# Patient Record
Sex: Male | Born: 1975 | Hispanic: No | Marital: Married | Smoking: Never smoker
Health system: Southern US, Community
[De-identification: ages and names within clinical notes are randomized; demographics above are authoritative.]

## PROBLEM LIST (undated history)

## (undated) HISTORY — PX: MANDIBLE SURGERY: SHX707

---

## 1998-09-15 DIAGNOSIS — F32A Depression, unspecified: Secondary | ICD-10-CM

## 1998-09-15 HISTORY — PX: CHOLECYSTECTOMY: SHX55

## 1998-09-15 HISTORY — DX: Depression, unspecified: F32.A

## 2016-10-17 DIAGNOSIS — C44612 Basal cell carcinoma of skin of right upper limb, including shoulder: Secondary | ICD-10-CM | POA: Diagnosis not present

## 2016-10-17 DIAGNOSIS — L821 Other seborrheic keratosis: Secondary | ICD-10-CM | POA: Diagnosis not present

## 2016-10-17 DIAGNOSIS — L814 Other melanin hyperpigmentation: Secondary | ICD-10-CM | POA: Diagnosis not present

## 2016-10-17 DIAGNOSIS — D225 Melanocytic nevi of trunk: Secondary | ICD-10-CM | POA: Diagnosis not present

## 2016-12-17 DIAGNOSIS — Z85828 Personal history of other malignant neoplasm of skin: Secondary | ICD-10-CM | POA: Diagnosis not present

## 2016-12-17 DIAGNOSIS — L905 Scar conditions and fibrosis of skin: Secondary | ICD-10-CM | POA: Diagnosis not present

## 2017-02-27 ENCOUNTER — Encounter (INDEPENDENT_AMBULATORY_CARE_PROVIDER_SITE_OTHER): Payer: Self-pay | Admitting: Orthopaedic Surgery

## 2017-02-27 ENCOUNTER — Ambulatory Visit (INDEPENDENT_AMBULATORY_CARE_PROVIDER_SITE_OTHER): Payer: BLUE CROSS/BLUE SHIELD | Admitting: Orthopaedic Surgery

## 2017-02-27 ENCOUNTER — Ambulatory Visit (INDEPENDENT_AMBULATORY_CARE_PROVIDER_SITE_OTHER): Payer: Self-pay

## 2017-02-27 VITALS — BP 136/88 | HR 59 | Ht 68.0 in | Wt 155.0 lb

## 2017-02-27 DIAGNOSIS — M7501 Adhesive capsulitis of right shoulder: Secondary | ICD-10-CM

## 2017-02-27 DIAGNOSIS — M25511 Pain in right shoulder: Secondary | ICD-10-CM

## 2017-02-27 NOTE — Progress Notes (Signed)
Office Visit Note   Patient: Micheal Griffith           Date of Birth: 12/04/1975           MRN: [id number] Visit Date: 02/27/2017              Requested by: No referring provider defined for this encounter. PCP: System, Pcp Not In   Assessment & Plan: Visit Diagnoses:  1. Acute pain of right shoulder   2. Adhesive capsulitis of right shoulder     Plan: We'll refer him for physical therapy. He is in considerable pain control with the dressing driving changing clothes etc. Subacromial injection performed with some slight improvement in his pain. I'll recheck him in 2 weeks.  Follow-Up Instructions: Return in about 2 weeks (around 03/13/2017).   Orders:  Orders Placed This Encounter  Procedures  . XR Shoulder Right   No orders of the defined types were placed in this encounter.     Procedures: No procedures performed   Clinical Data: No additional findings.   Subjective: Chief Complaint  Patient presents with  . Right Shoulder - Pain    HPI 41 year old male with the almost 2 week history of the right shoulder pain insidious. He normally does some pushups flies and some bicep curls and a regular basis but has not had any change in his activity levels. No injuries no past history of shoulder pain. Patient states he can only flex or abduct his shoulder 15. He can't reach his back pocket can't reach his opposite shoulder can get his hand to his ear and states the pain is severe. Negative history rheumatologic disease no fever or chills.  Review of Systems patient's been active healthy does a discharge. Negative history of the cervical spondylosis negative for rheumatologic disease no history of pneumonia no falls recently. Otherwise negative as it pertains to his history of present illness.   Objective: Vital Signs: BP 136/88   Pulse (!) 59   Ht 5\' 8"  (1.727 m)   Wt 155 lb (70.3 kg)   BMI 23.57 kg/m   Physical Exam  Constitutional: He is oriented to person, place,  and time. He appears well-developed and well-nourished.  HENT:  Head: Normocephalic and atraumatic.  Eyes: EOM are normal. Pupils are equal, round, and reactive to light.  Neck: No tracheal deviation present. No thyromegaly present.  Cardiovascular: Normal rate.   Pulmonary/Chest: Effort normal. He has no wheezes.  Abdominal: Soft. Bowel sounds are normal.  Musculoskeletal:  Patient has good cervical range of motion no break complex tenderness no periscapular atrophy. Abduction only 15. He can get his hand to the midaxillary line only with internal rotation. He can reach to his the opposite front pant pocket. Elbow has good flexion-extension sensation the hand is normal. Lung of the biceps is minimally tender coracoid is minimally tender. Shoulder has no increased warmth. Reflexes biceps triceps are intact. Normal heel toe gait medicine synovitis of the wrists fingers. No rash or exposed skin.  Neurological: He is alert and oriented to person, place, and time.  Skin: Skin is warm and dry. Capillary refill takes less than 2 seconds.  Psychiatric: He has a normal mood and affect. His behavior is normal. Judgment and thought content normal.    Ortho Exam  Specialty Comments:  No specialty comments available.  Imaging: Xr Shoulder Right  Result Date: 02/27/2017 Three-view x-rays right shoulder obtained which shows normal shoulder anatomy. Negative for fracture lung field is clear. Acromioclavicular  joint is normal. Impression normal right shoulder x-rays    PMFS History: There are no active problems to display for this patient.  No past medical history on file.  No family history on file.  No past surgical history on file. Social History   Occupational History  . Not on file.   Social History Main Topics  . Smoking status: Not on file  . Smokeless tobacco: Not on file  . Alcohol use Not on file  . Drug use: Unknown  . Sexual activity: Not on file

## 2017-03-25 ENCOUNTER — Ambulatory Visit (INDEPENDENT_AMBULATORY_CARE_PROVIDER_SITE_OTHER): Payer: BLUE CROSS/BLUE SHIELD | Admitting: Orthopaedic Surgery

## 2017-07-07 DIAGNOSIS — Z85828 Personal history of other malignant neoplasm of skin: Secondary | ICD-10-CM | POA: Diagnosis not present

## 2017-07-07 DIAGNOSIS — L249 Irritant contact dermatitis, unspecified cause: Secondary | ICD-10-CM | POA: Diagnosis not present

## 2018-02-09 DIAGNOSIS — J069 Acute upper respiratory infection, unspecified: Secondary | ICD-10-CM | POA: Diagnosis not present

## 2018-02-09 DIAGNOSIS — R05 Cough: Secondary | ICD-10-CM | POA: Diagnosis not present

## 2018-06-17 DIAGNOSIS — Z23 Encounter for immunization: Secondary | ICD-10-CM | POA: Diagnosis not present

## 2018-06-22 DIAGNOSIS — H5213 Myopia, bilateral: Secondary | ICD-10-CM | POA: Diagnosis not present

## 2018-06-22 DIAGNOSIS — H52223 Regular astigmatism, bilateral: Secondary | ICD-10-CM | POA: Diagnosis not present

## 2018-07-09 DIAGNOSIS — L821 Other seborrheic keratosis: Secondary | ICD-10-CM | POA: Diagnosis not present

## 2018-07-09 DIAGNOSIS — D1801 Hemangioma of skin and subcutaneous tissue: Secondary | ICD-10-CM | POA: Diagnosis not present

## 2018-07-09 DIAGNOSIS — D229 Melanocytic nevi, unspecified: Secondary | ICD-10-CM | POA: Diagnosis not present

## 2018-07-09 DIAGNOSIS — L814 Other melanin hyperpigmentation: Secondary | ICD-10-CM | POA: Diagnosis not present

## 2018-07-16 DIAGNOSIS — J300 Vasomotor rhinitis: Secondary | ICD-10-CM | POA: Diagnosis not present

## 2019-07-13 DIAGNOSIS — Z3009 Encounter for other general counseling and advice on contraception: Secondary | ICD-10-CM | POA: Diagnosis not present

## 2019-08-15 DIAGNOSIS — Z302 Encounter for sterilization: Secondary | ICD-10-CM | POA: Diagnosis not present

## 2019-09-25 DIAGNOSIS — M791 Myalgia, unspecified site: Secondary | ICD-10-CM | POA: Diagnosis not present

## 2019-09-25 DIAGNOSIS — R05 Cough: Secondary | ICD-10-CM | POA: Diagnosis not present

## 2019-10-27 DIAGNOSIS — Z20822 Contact with and (suspected) exposure to covid-19: Secondary | ICD-10-CM | POA: Diagnosis not present

## 2019-10-27 DIAGNOSIS — R5381 Other malaise: Secondary | ICD-10-CM | POA: Diagnosis not present

## 2019-10-27 DIAGNOSIS — R05 Cough: Secondary | ICD-10-CM | POA: Diagnosis not present

## 2019-10-27 DIAGNOSIS — M791 Myalgia, unspecified site: Secondary | ICD-10-CM | POA: Diagnosis not present

## 2019-12-01 ENCOUNTER — Ambulatory Visit: Payer: BC Managed Care – PPO | Attending: Internal Medicine

## 2019-12-01 DIAGNOSIS — Z23 Encounter for immunization: Secondary | ICD-10-CM

## 2019-12-01 NOTE — Progress Notes (Signed)
   Covid-19 Vaccination Clinic  Name:  JAMIEL GONCALVES    MRN: [id number] DOB: 22-Mar-1976  12/01/2019  Mr. Hanway was observed post Covid-19 immunization for 15 minutes without incident. He was provided with Vaccine Information Sheet and instruction to access the V-Safe system.   Mr. Tschantz was instructed to call 911 with any severe reactions post vaccine: Marland Kitchen Difficulty breathing  . Swelling of face and throat  . A fast heartbeat  . A bad rash all over body  . Dizziness and weakness   Immunizations Administered    Name Date Dose VIS Date Route   Pfizer COVID-19 Vaccine 12/01/2019  9:02 AM 0.3 mL 08/26/2019 Intramuscular   Manufacturer: [employer], [employer]   Lot: [id number]   NDC: [address]-5

## 2019-12-26 ENCOUNTER — Ambulatory Visit: Payer: BC Managed Care – PPO | Attending: Internal Medicine

## 2019-12-26 DIAGNOSIS — Z23 Encounter for immunization: Secondary | ICD-10-CM

## 2019-12-26 NOTE — Progress Notes (Signed)
   Covid-19 Vaccination Clinic  Name:  Micheal Griffith    MRN: [id number] DOB: 07-Oct-1975  12/26/2019  [name] was observed post Covid-19 immunization for 15 minutes without incident. He was provided with Vaccine Information Sheet and instruction to access the V-Safe system.   [name] was instructed to call 911 with any severe reactions post vaccine: Marland Kitchen Difficulty breathing  . Swelling of face and throat  . A fast heartbeat  . A bad rash all over body  . Dizziness and weakness   Immunizations Administered    Name Date Dose VIS Date Route   Pfizer COVID-19 Vaccine 12/26/2019  9:01 AM 0.3 mL 08/26/2019 Intramuscular   Manufacturer: [employer], [employer]   Lot: [id number]   NDC: [address]-0

## 2020-01-03 ENCOUNTER — Ambulatory Visit (INDEPENDENT_AMBULATORY_CARE_PROVIDER_SITE_OTHER): Payer: BC Managed Care – PPO | Admitting: Otolaryngology

## 2020-01-03 ENCOUNTER — Other Ambulatory Visit: Payer: Self-pay

## 2020-01-03 DIAGNOSIS — J342 Deviated nasal septum: Secondary | ICD-10-CM | POA: Diagnosis not present

## 2020-01-03 DIAGNOSIS — J310 Chronic rhinitis: Secondary | ICD-10-CM | POA: Diagnosis not present

## 2020-01-03 DIAGNOSIS — J343 Hypertrophy of nasal turbinates: Secondary | ICD-10-CM

## 2020-01-03 NOTE — Progress Notes (Signed)
HPI: Micheal Griffith is a 44 y.o. male who presents for evaluation of intermittent chronic nasal obstruction.  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  He finally weaned himself off going cold [place] for 2 to 3 weeks.  He still has occasional nasal congestion especially at night when he sleeps.  More recently has started using decongestant spray at night but only on the left side.  He tends to have more difficulty breathing when the left side obstructs.  Denies any yellow-green discharge no paranasal sinus pain.  Mostly just nasal obstruction.  He has used Flonase in the past with some benefit but is presently not on any nasal steroid sprays. He is otherwise healthy. He is on no medications Allergies: Sulfa-based drugs  No past medical history on file.  Social History   Socioeconomic History  . Marital status: Single    Spouse name: Not on file  . Number of children: Not on file  . Years of education: Not on file  . Highest education level: Not on file  Occupational History  . Not on file  Tobacco Use  . Smoking status: Not on file  Substance and Sexual Activity  . Alcohol use: Not on file  . Drug use: Not on file  . Sexual activity: Not on file  Other Topics Concern  . Not on file  Social History Narrative  . Not on file   Social Determinants of Health   Financial Resource Strain:   . Difficulty of Paying Living Expenses:   Food Insecurity:   . Worried About Charity fundraiser in the Last Year:   . Arboriculturist in the Last Year:   Transportation Needs:   . Film/video editor (Medical):   Marland Kitchen Lack of Transportation (Non-Medical):   Physical Activity:   . Days of Exercise per Week:   . Minutes of Exercise per Session:   Stress:   . Feeling of Stress :   Social Connections:   . Frequency of Communication with Friends and Family:   . Frequency of Social Gatherings with Friends and Family:   .  Attends Religious Services:   . Active Member of Clubs or Organizations:   . Attends Archivist Meetings:   Marland Kitchen Marital Status:    No family history on file. Allergies  Allergen Reactions  . Sulfa Antibiotics    Prior to Admission medications   Not on File     Positive ROS: Otherwise negative  All other systems have been reviewed and were otherwise negative with the exception of those mentioned in the HPI and as above.  Physical Exam: Constitutional: Alert, well-appearing, no acute distress Ears: External ears without lesions or tenderness. Ear canals are clear bilaterally with intact, clear TMs.  Nasal: External nose without lesions. Septum deviated to the right.  He has moderate turbinate per trophy.  No polyps.  No obstructing lesions noted.  Both middle meatus regions are clear.  He has a narrow nasal valve on both sides but more narrow on the right side because of anterior septal deviation to the right.  He has slight nasal valve collapse on inhalation. Oral: Lips and gums without lesions. Tongue and palate mucosa without lesions. Posterior oropharynx clear. Neck: No palpable adenopathy or masses. Respiratory: Breathing comfortably.  Lungs clear to auscultation Cardiac exam: Regular rate and rhythm without murmur. Skin: No facial/neck lesions or rash noted.  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 Chronic rhinitis which is worse at night when he sleeps.  Plan: Recommended initially regular use of nasal steroid spray and prescribe either Nasacort or Flonase to use 2 sprays each nostril at night. If he continues to have nasal obstruction he would be a surgical candidate which would involve septoplasty and turbinate reductions with possible vestibuloplasty at least on the right side and possibly on the left side. He will initially try the nasal steroid spray and call us back if he decides to pursue surgery.  Narda Bonds, MD

## 2020-04-26 DIAGNOSIS — Z20822 Contact with and (suspected) exposure to covid-19: Secondary | ICD-10-CM | POA: Diagnosis not present

## 2020-06-28 DIAGNOSIS — Z23 Encounter for immunization: Secondary | ICD-10-CM | POA: Diagnosis not present

## 2020-08-13 ENCOUNTER — Encounter: Payer: Self-pay | Admitting: Internal Medicine

## 2020-08-13 ENCOUNTER — Ambulatory Visit: Payer: BC Managed Care – PPO | Admitting: Internal Medicine

## 2020-08-13 ENCOUNTER — Other Ambulatory Visit: Payer: Self-pay

## 2020-08-13 ENCOUNTER — Telehealth: Payer: Self-pay | Admitting: Internal Medicine

## 2020-08-13 VITALS — BP 114/78 | HR 65 | Temp 98.3°F | Resp 16 | Ht 68.0 in | Wt 157.0 lb

## 2020-08-13 DIAGNOSIS — F411 Generalized anxiety disorder: Secondary | ICD-10-CM | POA: Diagnosis not present

## 2020-08-13 DIAGNOSIS — F322 Major depressive disorder, single episode, severe without psychotic features: Secondary | ICD-10-CM

## 2020-08-13 LAB — BASIC METABOLIC PANEL
BUN: 19 mg/dL (ref 6–23)
CO2: 31 mEq/L (ref 19–32)
Calcium: 10 mg/dL (ref 8.4–10.5)
Chloride: 100 mEq/L (ref 96–112)
Creatinine, Ser: 0.79 mg/dL (ref 0.40–1.50)
GFR: 107.96 mL/min (ref >60.00)
Glucose, Bld: 98 mg/dL (ref 70–99)
Potassium: 3.7 mEq/L (ref 3.5–5.1)
Sodium: 138 mEq/L (ref 135–145)

## 2020-08-13 LAB — CBC WITH DIFFERENTIAL/PLATELET
Basophils Absolute: 0.1 10*3/uL (ref 0.0–0.1)
Basophils Relative: 1 % (ref 0.0–3.0)
Eosinophils Absolute: 0.2 10*3/uL (ref 0.0–0.7)
Eosinophils Relative: 2.6 % (ref 0.0–5.0)
HCT: 50.7 % (ref 39.0–52.0)
Hemoglobin: 17.4 g/dL — ABNORMAL HIGH (ref 13.0–17.0)
Lymphocytes Relative: 25.4 % (ref 12.0–46.0)
Lymphs Abs: 1.6 10*3/uL (ref 0.7–4.0)
MCHC: 34.3 g/dL (ref 30.0–36.0)
MCV: 92.9 fl (ref 78.0–100.0)
Monocytes Absolute: 0.4 10*3/uL (ref 0.1–1.0)
Monocytes Relative: 6.4 % (ref 3.0–12.0)
Neutro Abs: 4 10*3/uL (ref 1.4–7.7)
Neutrophils Relative %: 64.6 % (ref 43.0–77.0)
Platelets: 272 10*3/uL (ref 150.0–400.0)
RBC: 5.46 Mil/uL (ref 4.22–5.81)
RDW: 13.4 % (ref 11.5–15.5)
WBC: 6.3 10*3/uL (ref 4.0–10.5)

## 2020-08-13 LAB — HEPATIC FUNCTION PANEL
ALT: 27 U/L (ref 0–53)
AST: 22 U/L (ref 0–37)
Albumin: 5 g/dL (ref 3.5–5.2)
Alkaline Phosphatase: 58 U/L (ref 39–117)
Bilirubin, Direct: 0.1 mg/dL (ref 0.0–0.3)
Total Bilirubin: 0.7 mg/dL (ref 0.2–1.2)
Total Protein: 8.4 g/dL — ABNORMAL HIGH (ref 6.0–8.3)

## 2020-08-13 LAB — VITAMIN D 25 HYDROXY (VIT D DEFICIENCY, FRACTURES): VITD: 44.37 ng/mL (ref 30.00–100.00)

## 2020-08-13 MED ORDER — VORTIOXETINE HBR 10 MG PO TABS: 10.0000 mg | ORAL_TABLET | Freq: Every day | ORAL | 0 refills | Status: DC

## 2020-08-13 MED ORDER — VORTIOXETINE HBR 5 MG PO TABS: 5.0000 mg | ORAL_TABLET | Freq: Every day | ORAL | 0 refills | Status: AC

## 2020-08-13 MED ORDER — LOREEV XR 1 MG PO CS24: 1.0000 | EXTENDED_RELEASE_CAPSULE | Freq: Every day | ORAL | 1 refills | Status: DC | PRN

## 2020-08-13 NOTE — Patient Instructions (Signed)
Major Depressive Disorder, Adult Major depressive disorder (MDD) is a mental health condition. It may also be called clinical depression or unipolar depression. MDD usually causes feelings of sadness, hopelessness, or helplessness. MDD can also cause physical symptoms. It can interfere with work, school, relationships, and other everyday activities. MDD may be mild, moderate, or severe. It may occur once (single episode major depressive disorder) or it may occur multiple times (recurrent major depressive disorder). What are the causes? The exact cause of this condition is not known. MDD is most likely caused by a combination of things, which may include:  Genetic factors. These are traits that are passed along from parent to child.  Individual factors. Your personality, your behavior, and the way you handle your thoughts and feelings may contribute to MDD. This includes personality traits and behaviors learned from others.  Physical factors, such as: ? Differences in the part of your brain that controls emotion. This part of your brain may be different than it is in people who do not have MDD. ? Long-term (chronic) medical or psychiatric illnesses.  Social factors. Traumatic experiences or major life changes may play a role in the development of MDD. What increases the risk? This condition is more likely to develop in women. The following factors may also make you more likely to develop MDD:  A family history of depression.  Troubled family relationships.  Abnormally low levels of certain brain chemicals.  Traumatic events in childhood, especially abuse or the loss of a parent.  Being under a lot of stress, or long-term stress, especially from upsetting life experiences or losses.  A history of: ? Chronic physical illness. ? Other mental health disorders. ? Substance abuse.  Poor living conditions.  Experiencing social exclusion or discrimination on a regular basis. What are the  signs or symptoms? The main symptoms of MDD typically include:  Constant depressed or irritable mood.  Loss of interest in things and activities. MDD symptoms may also include:  Sleeping or eating too much or too little.  Unexplained weight change.  Fatigue or low energy.  Feelings of worthlessness or guilt.  Difficulty thinking clearly or making decisions.  Thoughts of suicide or of harming others.  Physical agitation or weakness.  Isolation. Severe cases of MDD may also occur with other symptoms, such as:  Delusions or hallucinations, in which you imagine things that are not real (psychotic depression).  Low-level depression that lasts at least a year (chronic depression or persistent depressive disorder).  Extreme sadness and hopelessness (melancholic depression).  Trouble speaking and moving (catatonic depression). How is this diagnosed? This condition may be diagnosed based on:  Your symptoms.  Your medical history, including your mental health history. This may involve tests to evaluate your mental health. You may be asked questions about your lifestyle, including any drug and alcohol use, and how long you have had symptoms of MDD.  A physical exam.  Blood tests to rule out other conditions. You must have a depressed mood and at least four other MDD symptoms most of the day, nearly every day in the same 2-week timeframe before your health care provider can confirm a diagnosis of MDD. How is this treated? This condition is usually treated by mental health professionals, such as psychologists, psychiatrists, and clinical social workers. You may need more than one type of treatment. Treatment may include:  Psychotherapy. This is also called talk therapy or counseling. Types of psychotherapy include: ? Cognitive behavioral therapy (CBT). This type of therapy   teaches you to recognize unhealthy feelings, thoughts, and behaviors, and replace them with positive thoughts  and actions. ? Interpersonal therapy (IPT). This helps you to improve the way you relate to and communicate with others. ? Family therapy. This treatment includes members of your family.  Medicine to treat anxiety and depression, or to help you control certain emotions and behaviors.  Lifestyle changes, such as: ? Limiting alcohol and drug use. ? Exercising regularly. ? Getting plenty of sleep. ? Making healthy eating choices. ? Spending more time outdoors.  Treatments involving stimulation of the brain can be used in situations with extremely severe symptoms, or when medicine or other therapies do not work over time. These treatments include electroconvulsive therapy, transcranial magnetic stimulation, and vagal nerve stimulation. Follow these instructions at home: Activity  Return to your normal activities as told by your health care provider.  Exercise regularly and spend time outdoors as told by your health care provider. General instructions  Take over-the-counter and prescription medicines only as told by your health care provider.  Do not drink alcohol. If you drink alcohol, limit your alcohol intake to no more than 1 drink a day for nonpregnant women and 2 drinks a day for men. One drink equals 12 oz of beer, 5 oz of wine, or 1 oz of hard liquor. Alcohol can affect any antidepressant medicines you are taking. Talk to your health care provider about your alcohol use.  Eat a healthy diet and get plenty of sleep.  Find activities that you enjoy doing, and make time to do them.  Consider joining a support group. Your health care provider may be able to recommend a support group.  Keep all follow-up visits as told by your health care provider. This is important. Where to find more information National Alliance on Mental Illness  www.nami.org [place] National Institute of Mental Health  www.nimh.nih.gov National Suicide Prevention Lifeline  1-800-273-TALK (8255). This is  free, 24-hour help. Contact a health care provider if:  Your symptoms get worse.  You develop new symptoms. Get help right away if:  You self-harm.  You have serious thoughts about hurting yourself or others.  You see, hear, taste, smell, or feel things that are not present (hallucinate). This information is not intended to replace advice given to you by your health care provider. Make sure you discuss any questions you have with your health care provider. Document Revised: 08/14/2017 Document Reviewed: 03/12/2016 Elsevier Patient Education  2020 Elsevier Inc.  

## 2020-08-13 NOTE — Telephone Encounter (Signed)
  LORazepam 1 MG [facility] stating that their entire district is out of this medication until two days from now and is wondering if we can get it sent to [id number] [place], [place] - [address] Phone:  [phone]  Fax:  [phone]

## 2020-08-13 NOTE — Progress Notes (Signed)
Subjective:  Patient ID: Micheal Griffith, male    DOB: 05-Aug-1976  Age: 44 y.o. MRN: [id number]  CC: Depression  This visit occurred during the SARS-CoV-2 public health emergency.  Safety protocols were in place, including screening questions prior to the visit, additional usage of staff PPE, and extensive cleaning of exam room while observing appropriate contact time as indicated for disinfecting solutions.   NEW TO ME  HPI Micheal Griffith presents for f/up - He has a hx of depression/anxiety, treated years ago with paxil. He has had a recurrence of symptoms over the last 4 months. He attributes this to stressors in the workplace. He has had a 29-month history of racing mind, panic attacks, insomnia (night terrors with gasping), anxiety, feeling hopeless and helpless, and decreased appetite. He does not smoke cigarettes or drink alcohol. He denies SI or HI.  Outpatient Medications Prior to Visit  Medication Sig Dispense Refill  . fluticasone (FLONASE) 50 MCG/ACT nasal spray Place into both nostrils.     No facility-administered medications prior to visit.    ROS Review of Systems  Constitutional: Positive for fatigue. Negative for appetite change, chills, diaphoresis, fever and unexpected weight change.  HENT: Negative.  Negative for trouble swallowing.   Eyes: Negative for visual disturbance.  Respiratory: Negative for apnea, cough, chest tightness, shortness of breath and wheezing.   Cardiovascular: Negative for chest pain, palpitations and leg swelling.  Gastrointestinal: Negative for abdominal pain, diarrhea, nausea and vomiting.  Endocrine: Negative.   Genitourinary: Negative.  Negative for difficulty urinating and dysuria.  Musculoskeletal: Negative.  Negative for arthralgias.  Skin: Negative.   Neurological: Negative.  Negative for dizziness, weakness, light-headedness and numbness.  Hematological: Negative for adenopathy. Does not bruise/bleed easily.  Psychiatric/Behavioral:  Positive for decreased concentration and dysphoric mood. Negative for agitation, behavioral problems, confusion, hallucinations, self-injury, sleep disturbance and suicidal ideas. The patient is nervous/anxious. The patient is not hyperactive.     Objective:  BP 114/78   Pulse 65   Temp 98.3 F (36.8 C) (Oral)   Resp 16   Ht 5\' 8"  (1.727 m)   Wt 157 lb (71.2 kg)   SpO2 98%   BMI 23.87 kg/m   BP Readings from Last 3 Encounters:  08/13/20 114/78  02/27/17 136/88    Wt Readings from Last 3 Encounters:  08/13/20 157 lb (71.2 kg)  02/27/17 155 lb (70.3 kg)    Physical Exam Vitals reviewed.  Constitutional:      Appearance: Normal appearance.  HENT:     Nose: Nose normal.     Mouth/Throat:     Mouth: Mucous membranes are moist.  Eyes:     General: No scleral icterus.    Conjunctiva/sclera: Conjunctivae normal.  Cardiovascular:     Rate and Rhythm: Normal rate and regular rhythm.     Heart sounds: No murmur heard.   Pulmonary:     Effort: Pulmonary effort is normal.     Breath sounds: No stridor. No wheezing, rhonchi or rales.  Abdominal:     General: Bowel sounds are normal. There is no distension.     Palpations: Abdomen is soft. There is no hepatomegaly, splenomegaly or mass.     Tenderness: There is no abdominal tenderness.  Musculoskeletal:        General: Normal range of motion.     Cervical back: Neck supple.     Right lower leg: No edema.     Left lower leg: No edema.  Lymphadenopathy:  Cervical: No cervical adenopathy.  Skin:    General: Skin is warm and dry.     Coloration: Skin is not pale.  Neurological:     General: No focal deficit present.     Mental Status: He is alert and oriented to person, place, and time. Mental status is at baseline.  Psychiatric:        Attention and Perception: Perception normal. He is inattentive.        Mood and Affect: Mood is anxious and depressed. Mood is not elated. Affect is not labile, blunt, flat, angry,  tearful or inappropriate.        Speech: He is communicative. Speech is tangential. Speech is not rapid and pressured, delayed or slurred.        Behavior: Behavior normal. Behavior is not slowed or withdrawn.        Thought Content: Thought content normal. Thought content is not paranoid. Thought content does not include homicidal or suicidal ideation. Thought content does not include homicidal or suicidal plan.        Cognition and Memory: Cognition normal.        Judgment: Judgment normal.     Lab Results  Component Value Date   WBC 6.3 08/13/2020   HGB 17.4 (H) 08/13/2020   HCT 50.7 08/13/2020   PLT 272.0 08/13/2020   GLUCOSE 98 08/13/2020   ALT 27 08/13/2020   AST 22 08/13/2020   NA 138 08/13/2020   K 3.7 08/13/2020   CL 100 08/13/2020   CREATININE 0.79 08/13/2020   BUN 19 08/13/2020   CO2 31 08/13/2020   TSH 1.38 08/13/2020    Patient was never admitted.  Assessment & Plan:   Micheal Griffith was seen today for depression.  Diagnoses and all orders for this visit:  Current severe episode of major depressive disorder without psychotic features without prior episode [facility])- His labs are negative for secondary causes. Will start vortioxetine and will increase the dose over time. He also has an element of anxiety/panic so I recommended that he start taking an extended release benzodiazepine. -     vortioxetine HBr (TRINTELLIX) 5 MG TABS tablet; Take 1 tablet (5 mg total) by mouth daily for 7 days. -     vortioxetine HBr (TRINTELLIX) 10 MG TABS tablet; Take 1 tablet (10 mg total) by mouth daily for 14 days. -     CBC with Differential/Platelet; Future -     Basic metabolic panel; Future -     Cancel: TSH; Future -     Hepatic function panel; Future -     VITAMIN D 25 Hydroxy (Vit-D Deficiency, Fractures); Future -     Thyroid Panel With TSH; Future -     Thyroid Panel With TSH -     VITAMIN D 25 Hydroxy (Vit-D Deficiency, Fractures) -     Hepatic function panel -     Basic  metabolic panel -     CBC with Differential/Platelet  GAD (generalized anxiety disorder)- See above. -     Discontinue: LORazepam ER (LOREEV XR) 1 MG CS24; Take 1 tablet by mouth daily as needed. -     Cancel: TSH; Future -     Thyroid Panel With TSH; Future -     Thyroid Panel With TSH -     LORazepam ER (LOREEV XR) 1 MG CS24; Take 1 tablet by mouth daily as needed.   I am having Micheal Griffith. Micheal Griffith start on vortioxetine HBr and vortioxetine HBr. I  am also having him maintain his fluticasone and Loreev XR.  Meds ordered this encounter  Medications  . vortioxetine HBr (TRINTELLIX) 5 MG TABS tablet    Sig: Take 1 tablet (5 mg total) by mouth daily for 7 days.    Dispense:  7 tablet    Refill:  0  . vortioxetine HBr (TRINTELLIX) 10 MG TABS tablet    Sig: Take 1 tablet (10 mg total) by mouth daily for 14 days.    Dispense:  14 tablet    Refill:  0  . DISCONTD: LORazepam ER (LOREEV XR) 1 MG CS24    Sig: Take 1 tablet by mouth daily as needed.    Dispense:  90 capsule    Refill:  1  . LORazepam ER (LOREEV XR) 1 MG CS24    Sig: Take 1 tablet by mouth daily as needed.    Dispense:  90 capsule    Refill:  1     Follow-up: Return in about 4 weeks (around 09/10/2020).  Sanda Linger, MD

## 2020-08-14 ENCOUNTER — Encounter: Payer: Self-pay | Admitting: Internal Medicine

## 2020-08-14 LAB — THYROID PANEL WITH TSH
Free Thyroxine Index: 2.5 (ref 1.4–3.8)
T3 Uptake: 32 % (ref 22–35)
T4, Total: 7.9 ug/dL (ref 4.9–10.5)
TSH: 1.38 mIU/L (ref 0.40–4.50)

## 2020-08-28 ENCOUNTER — Encounter: Payer: Self-pay | Admitting: Internal Medicine

## 2020-08-28 ENCOUNTER — Other Ambulatory Visit: Payer: Self-pay | Admitting: Internal Medicine

## 2020-08-28 DIAGNOSIS — F322 Major depressive disorder, single episode, severe without psychotic features: Secondary | ICD-10-CM

## 2020-08-28 DIAGNOSIS — F411 Generalized anxiety disorder: Secondary | ICD-10-CM

## 2020-08-28 MED ORDER — LOREEV XR 3 MG PO CS24: 1.0000 | EXTENDED_RELEASE_CAPSULE | Freq: Every day | ORAL | 1 refills | Status: DC | PRN

## 2020-08-28 MED ORDER — QUETIAPINE FUMARATE 25 MG PO TABS: 25.0000 mg | ORAL_TABLET | Freq: Every day | ORAL | 0 refills | Status: DC

## 2020-08-28 MED ORDER — VORTIOXETINE HBR 10 MG PO TABS: 10.0000 mg | ORAL_TABLET | Freq: Every day | ORAL | 1 refills | Status: DC

## 2020-08-29 NOTE — Telephone Encounter (Signed)
    Patient has concerns about possible side effects from QUEtiapine (SEROQUEL) 25 MG tablet He has questions before taking medication

## 2020-09-03 ENCOUNTER — Encounter: Payer: Self-pay | Admitting: Internal Medicine

## 2020-09-03 ENCOUNTER — Ambulatory Visit: Payer: BC Managed Care – PPO | Admitting: Internal Medicine

## 2020-09-03 ENCOUNTER — Other Ambulatory Visit: Payer: Self-pay

## 2020-09-03 VITALS — BP 114/76 | HR 62 | Temp 98.4°F | Resp 16 | Ht 68.0 in | Wt 160.0 lb

## 2020-09-03 DIAGNOSIS — F322 Major depressive disorder, single episode, severe without psychotic features: Secondary | ICD-10-CM | POA: Diagnosis not present

## 2020-09-03 NOTE — Progress Notes (Signed)
Subjective:  Patient ID: Micheal Griffith, male    DOB: 1976/02/25  Age: 44 y.o. MRN: [id number]  CC: Depression  This visit occurred during the SARS-CoV-2 public health emergency.  Safety protocols were in place, including screening questions prior to the visit, additional usage of staff PPE, and extensive cleaning of exam room while observing appropriate contact time as indicated for disinfecting solutions.    HPI [name] presents for f/up - He reports that his symptoms of depression are improving with vortioxetine and lorazepam.  He tells me the dark thoughts and feeling desponded are better.  He continues to have trouble sleeping with frequent awakenings and early morning awakenings.  He still feels anxious and has missed work because of anxiety.  I had prescribed quetiapine several days ago but he has not started taking it yet because he read it was for schizophrenia and bipolar disorder.  Outpatient Medications Prior to Visit  Medication Sig Dispense Refill  . fluticasone (FLONASE) 50 MCG/ACT nasal spray Place into both nostrils.    Marland Kitchen LORazepam ER (LOREEV XR) 3 MG CS24 Take 1 capsule by mouth daily as needed. 90 capsule 1  . QUEtiapine (SEROQUEL) 25 MG tablet Take 1 tablet (25 mg total) by mouth at bedtime. 90 tablet 0  . vortioxetine HBr (TRINTELLIX) 10 MG TABS tablet Take 1 tablet (10 mg total) by mouth daily. 90 tablet 1   No facility-administered medications prior to visit.    ROS Review of Systems  Constitutional: Positive for unexpected weight change (wt gain).  HENT: Negative.   Eyes: Negative.   Respiratory: Negative for cough and shortness of breath.   Cardiovascular: Negative for chest pain, palpitations and leg swelling.  Gastrointestinal: Negative for abdominal pain.  Endocrine: Negative.   Genitourinary: Negative.   Musculoskeletal: Negative.   Skin: Negative.   Neurological: Negative.  Negative for dizziness, weakness and light-headedness.  Hematological:  Negative for adenopathy. Does not bruise/bleed easily.  Psychiatric/Behavioral: Positive for dysphoric mood and sleep disturbance. Negative for agitation, behavioral problems, confusion, decreased concentration, hallucinations, self-injury and suicidal ideas. The patient is nervous/anxious. The patient is not hyperactive.     Objective:  BP 114/76   Pulse 62   Temp 98.4 F (36.9 C) (Oral)   Resp 16   Ht 5\' 8"  (1.727 m)   Wt 160 lb (72.6 kg)   SpO2 99%   BMI 24.33 kg/m   BP Readings from Last 3 Encounters:  09/03/20 114/76  08/13/20 114/78  02/27/17 136/88    Wt Readings from Last 3 Encounters:  09/03/20 160 lb (72.6 kg)  08/13/20 157 lb (71.2 kg)  02/27/17 155 lb (70.3 kg)    Physical Exam Vitals reviewed.  Neurological:     Mental Status: He is alert.  Psychiatric:        Attention and Perception: Attention normal.        Mood and Affect: Mood is anxious. Mood is not depressed or elated. Affect is not labile, flat, angry, tearful or inappropriate.        Speech: Speech is tangential. Speech is not rapid and pressured, delayed or slurred.        Behavior: Behavior normal. Behavior is not slowed, aggressive, withdrawn or hyperactive.        Thought Content: Thought content normal. Thought content is not paranoid. Thought content does not include homicidal or suicidal ideation. Thought content does not include homicidal plan.        Cognition and Memory: Cognition normal.  Judgment: Judgment normal.     Lab Results  Component Value Date   WBC 6.3 08/13/2020   HGB 17.4 (H) 08/13/2020   HCT 50.7 08/13/2020   PLT 272.0 08/13/2020   GLUCOSE 98 08/13/2020   ALT 27 08/13/2020   AST 22 08/13/2020   NA 138 08/13/2020   K 3.7 08/13/2020   CL 100 08/13/2020   CREATININE 0.79 08/13/2020   BUN 19 08/13/2020   CO2 31 08/13/2020   TSH 1.38 08/13/2020    Patient was never admitted.  Assessment & Plan:   Santosh was seen today for depression.  Diagnoses and all  orders for this visit:  Current severe episode of major depressive disorder without psychotic features without prior episode (HCC)- Some improvement noted.  He has some lingering symptoms of depression and anxiety so I recommended that he start taking quetiapine and he agrees to do this.  Will increase the dose of quetiapine over time.  Will stay at the current dose of vortioxetine and lorazepam.   I am having Daryel Gerald. Eberwein maintain his fluticasone, vortioxetine HBr, Loreev XR, and QUEtiapine.  No orders of the defined types were placed in this encounter.    Follow-up: Return in about 3 months (around 12/02/2020).  Sanda Linger, MD

## 2020-09-04 ENCOUNTER — Encounter: Payer: Self-pay | Admitting: Internal Medicine

## 2020-09-04 NOTE — Patient Instructions (Signed)
Major Depressive Disorder, Adult Major depressive disorder (MDD) is a mental health condition. It may also be called clinical depression or unipolar depression. MDD usually causes feelings of sadness, hopelessness, or helplessness. MDD can also cause physical symptoms. It can interfere with work, school, relationships, and other everyday activities. MDD may be mild, moderate, or severe. It may occur once (single episode major depressive disorder) or it may occur multiple times (recurrent major depressive disorder). What are the causes? The exact cause of this condition is not known. MDD is most likely caused by a combination of things, which may include:  Genetic factors. These are traits that are passed along from parent to child.  Individual factors. Your personality, your behavior, and the way you handle your thoughts and feelings may contribute to MDD. This includes personality traits and behaviors learned from others.  Physical factors, such as: ? Differences in the part of your brain that controls emotion. This part of your brain may be different than it is in people who do not have MDD. ? Long-term (chronic) medical or psychiatric illnesses.  Social factors. Traumatic experiences or major life changes may play a role in the development of MDD. What increases the risk? This condition is more likely to develop in women. The following factors may also make you more likely to develop MDD:  A family history of depression.  Troubled family relationships.  Abnormally low levels of certain brain chemicals.  Traumatic events in childhood, especially abuse or the loss of a parent.  Being under a lot of stress, or long-term stress, especially from upsetting life experiences or losses.  A history of: ? Chronic physical illness. ? Other mental health disorders. ? Substance abuse.  Poor living conditions.  Experiencing social exclusion or discrimination on a regular basis. What are the  signs or symptoms? The main symptoms of MDD typically include:  Constant depressed or irritable mood.  Loss of interest in things and activities. MDD symptoms may also include:  Sleeping or eating too much or too little.  Unexplained weight change.  Fatigue or low energy.  Feelings of worthlessness or guilt.  Difficulty thinking clearly or making decisions.  Thoughts of suicide or of harming others.  Physical agitation or weakness.  Isolation. Severe cases of MDD may also occur with other symptoms, such as:  Delusions or hallucinations, in which you imagine things that are not real (psychotic depression).  Low-level depression that lasts at least a year (chronic depression or persistent depressive disorder).  Extreme sadness and hopelessness (melancholic depression).  Trouble speaking and moving (catatonic depression). How is this diagnosed? This condition may be diagnosed based on:  Your symptoms.  Your medical history, including your mental health history. This may involve tests to evaluate your mental health. You may be asked questions about your lifestyle, including any drug and alcohol use, and how long you have had symptoms of MDD.  A physical exam.  Blood tests to rule out other conditions. You must have a depressed mood and at least four other MDD symptoms most of the day, nearly every day in the same 2-week timeframe before your health care provider can confirm a diagnosis of MDD. How is this treated? This condition is usually treated by mental health professionals, such as psychologists, psychiatrists, and clinical social workers. You may need more than one type of treatment. Treatment may include:  Psychotherapy. This is also called talk therapy or counseling. Types of psychotherapy include: ? Cognitive behavioral therapy (CBT). This type of therapy   teaches you to recognize unhealthy feelings, thoughts, and behaviors, and replace them with positive thoughts  and actions. ? Interpersonal therapy (IPT). This helps you to improve the way you relate to and communicate with others. ? Family therapy. This treatment includes members of your family.  Medicine to treat anxiety and depression, or to help you control certain emotions and behaviors.  Lifestyle changes, such as: ? Limiting alcohol and drug use. ? Exercising regularly. ? Getting plenty of sleep. ? Making healthy eating choices. ? Spending more time outdoors.  Treatments involving stimulation of the brain can be used in situations with extremely severe symptoms, or when medicine or other therapies do not work over time. These treatments include electroconvulsive therapy, transcranial magnetic stimulation, and vagal nerve stimulation. Follow these instructions at home: Activity  Return to your normal activities as told by your health care provider.  Exercise regularly and spend time outdoors as told by your health care provider. General instructions  Take over-the-counter and prescription medicines only as told by your health care provider.  Do not drink alcohol. If you drink alcohol, limit your alcohol intake to no more than 1 drink a day for nonpregnant women and 2 drinks a day for men. One drink equals 12 oz of beer, 5 oz of wine, or 1 oz of hard liquor. Alcohol can affect any antidepressant medicines you are taking. Talk to your health care provider about your alcohol use.  Eat a healthy diet and get plenty of sleep.  Find activities that you enjoy doing, and make time to do them.  Consider joining a support group. Your health care provider may be able to recommend a support group.  Keep all follow-up visits as told by your health care provider. This is important. Where to find more information National Alliance on Mental Illness  www.nami.org [place] National Institute of Mental Health  www.nimh.nih.gov National Suicide Prevention Lifeline  1-800-273-TALK (8255). This is  free, 24-hour help. Contact a health care provider if:  Your symptoms get worse.  You develop new symptoms. Get help right away if:  You self-harm.  You have serious thoughts about hurting yourself or others.  You see, hear, taste, smell, or feel things that are not present (hallucinate). This information is not intended to replace advice given to you by your health care provider. Make sure you discuss any questions you have with your health care provider. Document Revised: 08/14/2017 Document Reviewed: 03/12/2016 Elsevier Patient Education  2020 Elsevier Inc.  

## 2020-09-19 DIAGNOSIS — L820 Inflamed seborrheic keratosis: Secondary | ICD-10-CM | POA: Diagnosis not present

## 2020-09-19 DIAGNOSIS — L570 Actinic keratosis: Secondary | ICD-10-CM | POA: Diagnosis not present

## 2020-10-01 ENCOUNTER — Other Ambulatory Visit: Payer: Self-pay | Admitting: Internal Medicine

## 2020-10-01 ENCOUNTER — Encounter: Payer: Self-pay | Admitting: Internal Medicine

## 2020-10-01 DIAGNOSIS — F322 Major depressive disorder, single episode, severe without psychotic features: Secondary | ICD-10-CM

## 2020-10-01 MED ORDER — VORTIOXETINE HBR 10 MG PO TABS: 10.0000 mg | ORAL_TABLET | Freq: Every day | ORAL | 1 refills | Status: AC

## 2020-10-05 DIAGNOSIS — F329 Major depressive disorder, single episode, unspecified: Secondary | ICD-10-CM | POA: Diagnosis not present

## 2020-10-18 DIAGNOSIS — F411 Generalized anxiety disorder: Secondary | ICD-10-CM | POA: Diagnosis not present

## 2020-10-23 DIAGNOSIS — F411 Generalized anxiety disorder: Secondary | ICD-10-CM | POA: Diagnosis not present

## 2020-10-25 DIAGNOSIS — F411 Generalized anxiety disorder: Secondary | ICD-10-CM | POA: Diagnosis not present

## 2020-11-13 DIAGNOSIS — F411 Generalized anxiety disorder: Secondary | ICD-10-CM | POA: Diagnosis not present

## 2020-11-21 DIAGNOSIS — F411 Generalized anxiety disorder: Secondary | ICD-10-CM | POA: Diagnosis not present

## 2020-12-18 DIAGNOSIS — F411 Generalized anxiety disorder: Secondary | ICD-10-CM | POA: Diagnosis not present

## 2021-01-14 ENCOUNTER — Telehealth (INDEPENDENT_AMBULATORY_CARE_PROVIDER_SITE_OTHER): Payer: BC Managed Care – PPO | Admitting: Internal Medicine

## 2021-01-14 ENCOUNTER — Telehealth: Payer: Self-pay | Admitting: Family Medicine

## 2021-01-14 DIAGNOSIS — F411 Generalized anxiety disorder: Secondary | ICD-10-CM | POA: Diagnosis not present

## 2021-01-14 DIAGNOSIS — J309 Allergic rhinitis, unspecified: Secondary | ICD-10-CM | POA: Diagnosis not present

## 2021-01-14 DIAGNOSIS — J329 Chronic sinusitis, unspecified: Secondary | ICD-10-CM

## 2021-01-14 MED ORDER — LEVOFLOXACIN 500 MG PO TABS: 500.0000 mg | ORAL_TABLET | Freq: Every day | ORAL | 0 refills | Status: AC

## 2021-01-14 MED ORDER — GUAIFENESIN ER 600 MG PO TB12: 1200.0000 mg | ORAL_TABLET | Freq: Two times a day (BID) | ORAL | 2 refills | Status: DC | PRN

## 2021-01-14 NOTE — Telephone Encounter (Signed)
Pt is wanting a TOC from Etta Grandchild, MD [facility] to Dr. Vicente Serene is much closer to his house. Is it ok for me to schedule this.

## 2021-01-14 NOTE — Progress Notes (Signed)
Patient ID: Micheal Griffith, male   DOB: January 08, 1976, 45 y.o.   MRN: [id number]  Virtual Visit via Video Note  I connected with Micheal Griffith on 01/14/21 at  2:20 PM EDT by a video enabled telemedicine application and verified that I am speaking with the correct person using two identifiers.  Location of all participants today Patient: at home Provider: at office   I discussed the limitations of evaluation and management by telemedicine and the availability of in person appointments. The patient expressed understanding and agreed to proceed.  History of Present Illness:  Here with 2-3 days acute onset fever, facial pain, pressure, headache, general weakness and malaise, and greenish d/c, with mild ST and cough, but pt denies chest pain, wheezing, increased sob or doe, orthopnea, PND, increased LE swelling, palpitations, dizziness or syncope.  Covid neg home testing yesterday.   Pt denies polydipsia, polyuria, or new focal neuro symptoms.  Denies worsening depressive symptoms, suicidal ideation, or panic.  Does have several wks ongoing nasal allergy symptoms with clearish congestion, itch and sneezing, without fever, pain, ST, cough, swelling or wheezing, but has been stable on flonase Past Medical History:  Diagnosis Date  . Depression 2000   Past Surgical History:  Procedure Laterality Date  . CHOLECYSTECTOMY  2000  . MANDIBLE SURGERY      reports that he has never smoked. He has never used smokeless tobacco. He reports that he does not drink alcohol and does not use drugs. family history is not on file. Allergies  Allergen Reactions  . Sulfa Antibiotics    Current Outpatient Medications on File Prior to Visit  Medication Sig Dispense Refill  . fluticasone (FLONASE) 50 MCG/ACT nasal spray Place into both nostrils.    Marland Kitchen LORazepam ER (LOREEV XR) 3 MG CS24 Take 1 capsule by mouth daily as needed. 90 capsule 1  . QUEtiapine (SEROQUEL) 25 MG tablet Take 1 tablet (25 mg total) by mouth at  bedtime. 90 tablet 0  . vortioxetine HBr (TRINTELLIX) 10 MG TABS tablet Take 1 tablet (10 mg total) by mouth daily. 90 tablet 1   No current facility-administered medications on file prior to visit.    Observations/Objective: The patient will observe these symptoms, and report promptly any worsening or unexpected persistence.  If well, may return prn. Lab Results  Component Value Date   WBC 6.3 08/13/2020   HGB 17.4 (H) 08/13/2020   HCT 50.7 08/13/2020   PLT 272.0 08/13/2020   GLUCOSE 98 08/13/2020   ALT 27 08/13/2020   AST 22 08/13/2020   NA 138 08/13/2020   K 3.7 08/13/2020   CL 100 08/13/2020   CREATININE 0.79 08/13/2020   BUN 19 08/13/2020   CO2 31 08/13/2020   TSH 1.38 08/13/2020   Assessment and Plan: See notes  Follow Up Instructions: See notes   I discussed the assessment and treatment plan with the patient. The patient was provided an opportunity to ask questions and all were answered. The patient agreed with the plan and demonstrated an understanding of the instructions.   The patient was advised to call back or seek an in-person evaluation if the symptoms worsen or if the condition fails to improve as anticipated.   Oliver Barre, MD

## 2021-01-14 NOTE — Telephone Encounter (Signed)
Yes, that is okay with me 

## 2021-01-15 DIAGNOSIS — F411 Generalized anxiety disorder: Secondary | ICD-10-CM | POA: Diagnosis not present

## 2021-01-16 NOTE — Telephone Encounter (Signed)
I have had multiple TOC requests so please see if pt is comfortable seeing Dr. Veto Kemps and schedule with him. If pt declines, then ok to schedule with me

## 2021-01-17 NOTE — Telephone Encounter (Signed)
I called pt and left a vm asking him to call me back. I will find out if he wants to [facility] to Dr. Veto Kemps.

## 2021-01-20 ENCOUNTER — Encounter: Payer: Self-pay | Admitting: Internal Medicine

## 2021-01-20 DIAGNOSIS — J329 Chronic sinusitis, unspecified: Secondary | ICD-10-CM | POA: Insufficient documentation

## 2021-01-20 DIAGNOSIS — J309 Allergic rhinitis, unspecified: Secondary | ICD-10-CM | POA: Insufficient documentation

## 2021-01-20 NOTE — Assessment & Plan Note (Signed)
Stable, cont benzo prn

## 2021-01-20 NOTE — Patient Instructions (Signed)
Please take all new medication as prescribed 

## 2021-01-20 NOTE — Assessment & Plan Note (Addendum)
Mild to mod, for antibx course,  to f/u any worsening symptoms or concerns 

## 2021-01-20 NOTE — Assessment & Plan Note (Signed)
Mild to mod, for contd flonase asd,  to f/u any worsening symptoms or concerns 

## 2021-01-21 NOTE — Telephone Encounter (Signed)
I called pt and left a vm to give me a cb to schedule a TOC appointment for him.

## 2021-01-25 NOTE — Telephone Encounter (Signed)
I scheduled an appointment with Dr. Veto Kemps for him.

## 2021-02-12 DIAGNOSIS — F411 Generalized anxiety disorder: Secondary | ICD-10-CM | POA: Diagnosis not present

## 2021-02-27 DIAGNOSIS — L430 Hypertrophic lichen planus: Secondary | ICD-10-CM | POA: Diagnosis not present

## 2021-02-27 DIAGNOSIS — D485 Neoplasm of uncertain behavior of skin: Secondary | ICD-10-CM | POA: Diagnosis not present

## 2021-03-04 DIAGNOSIS — F411 Generalized anxiety disorder: Secondary | ICD-10-CM | POA: Diagnosis not present

## 2021-03-05 ENCOUNTER — Ambulatory Visit: Payer: BC Managed Care – PPO | Admitting: Family Medicine

## 2021-03-05 DIAGNOSIS — Z0289 Encounter for other administrative examinations: Secondary | ICD-10-CM

## 2021-04-16 DIAGNOSIS — F411 Generalized anxiety disorder: Secondary | ICD-10-CM | POA: Diagnosis not present

## 2021-05-06 DIAGNOSIS — R051 Acute cough: Secondary | ICD-10-CM | POA: Diagnosis not present

## 2021-05-06 DIAGNOSIS — M791 Myalgia, unspecified site: Secondary | ICD-10-CM | POA: Diagnosis not present

## 2021-05-06 DIAGNOSIS — R0981 Nasal congestion: Secondary | ICD-10-CM | POA: Diagnosis not present

## 2021-05-06 DIAGNOSIS — Z20822 Contact with and (suspected) exposure to covid-19: Secondary | ICD-10-CM | POA: Diagnosis not present

## 2021-06-27 DIAGNOSIS — Z23 Encounter for immunization: Secondary | ICD-10-CM | POA: Diagnosis not present

## 2021-07-08 DIAGNOSIS — F411 Generalized anxiety disorder: Secondary | ICD-10-CM | POA: Diagnosis not present

## 2021-07-30 DIAGNOSIS — F411 Generalized anxiety disorder: Secondary | ICD-10-CM | POA: Diagnosis not present

## 2021-08-27 DIAGNOSIS — F411 Generalized anxiety disorder: Secondary | ICD-10-CM | POA: Diagnosis not present

## 2021-09-19 DIAGNOSIS — D225 Melanocytic nevi of trunk: Secondary | ICD-10-CM | POA: Diagnosis not present

## 2021-09-19 DIAGNOSIS — D2272 Melanocytic nevi of left lower limb, including hip: Secondary | ICD-10-CM | POA: Diagnosis not present

## 2021-09-19 DIAGNOSIS — D2271 Melanocytic nevi of right lower limb, including hip: Secondary | ICD-10-CM | POA: Diagnosis not present

## 2021-09-19 DIAGNOSIS — Z85828 Personal history of other malignant neoplasm of skin: Secondary | ICD-10-CM | POA: Diagnosis not present

## 2021-11-14 DIAGNOSIS — K625 Hemorrhage of anus and rectum: Secondary | ICD-10-CM | POA: Diagnosis not present

## 2021-11-14 DIAGNOSIS — R197 Diarrhea, unspecified: Secondary | ICD-10-CM | POA: Diagnosis not present

## 2021-11-14 DIAGNOSIS — M545 Low back pain, unspecified: Secondary | ICD-10-CM | POA: Diagnosis not present

## 2021-11-19 DIAGNOSIS — F411 Generalized anxiety disorder: Secondary | ICD-10-CM | POA: Diagnosis not present

## 2021-11-20 DIAGNOSIS — K625 Hemorrhage of anus and rectum: Secondary | ICD-10-CM | POA: Diagnosis not present

## 2021-11-20 DIAGNOSIS — R194 Change in bowel habit: Secondary | ICD-10-CM | POA: Diagnosis not present

## 2021-11-20 DIAGNOSIS — Z1211 Encounter for screening for malignant neoplasm of colon: Secondary | ICD-10-CM | POA: Diagnosis not present

## 2021-12-09 DIAGNOSIS — F411 Generalized anxiety disorder: Secondary | ICD-10-CM | POA: Diagnosis not present

## 2021-12-24 DIAGNOSIS — F419 Anxiety disorder, unspecified: Secondary | ICD-10-CM | POA: Diagnosis not present

## 2021-12-24 DIAGNOSIS — G4700 Insomnia, unspecified: Secondary | ICD-10-CM | POA: Diagnosis not present

## 2021-12-27 DIAGNOSIS — G4700 Insomnia, unspecified: Secondary | ICD-10-CM | POA: Diagnosis not present

## 2021-12-27 DIAGNOSIS — F419 Anxiety disorder, unspecified: Secondary | ICD-10-CM | POA: Diagnosis not present

## 2021-12-27 DIAGNOSIS — R0789 Other chest pain: Secondary | ICD-10-CM | POA: Diagnosis not present

## 2022-01-02 DIAGNOSIS — R0602 Shortness of breath: Secondary | ICD-10-CM | POA: Diagnosis not present

## 2022-01-02 DIAGNOSIS — R0789 Other chest pain: Secondary | ICD-10-CM | POA: Diagnosis not present

## 2022-01-02 DIAGNOSIS — R898 Other abnormal findings in specimens from other organs, systems and tissues: Secondary | ICD-10-CM | POA: Diagnosis not present

## 2022-01-03 ENCOUNTER — Emergency Department (HOSPITAL_BASED_OUTPATIENT_CLINIC_OR_DEPARTMENT_OTHER)
Admission: EM | Admit: 2022-01-03 | Discharge: 2022-01-03 | Disposition: A | Discharge status: Home / Self Care | Payer: BC Managed Care – PPO | Attending: Emergency Medicine | Admitting: Emergency Medicine

## 2022-01-03 ENCOUNTER — Encounter (HOSPITAL_BASED_OUTPATIENT_CLINIC_OR_DEPARTMENT_OTHER): Payer: Self-pay

## 2022-01-03 ENCOUNTER — Other Ambulatory Visit: Payer: Self-pay

## 2022-01-03 ENCOUNTER — Emergency Department (HOSPITAL_BASED_OUTPATIENT_CLINIC_OR_DEPARTMENT_OTHER): Payer: BC Managed Care – PPO

## 2022-01-03 DIAGNOSIS — R918 Other nonspecific abnormal finding of lung field: Secondary | ICD-10-CM | POA: Diagnosis not present

## 2022-01-03 DIAGNOSIS — R799 Abnormal finding of blood chemistry, unspecified: Secondary | ICD-10-CM | POA: Diagnosis not present

## 2022-01-03 DIAGNOSIS — R7989 Other specified abnormal findings of blood chemistry: Secondary | ICD-10-CM

## 2022-01-03 DIAGNOSIS — R791 Abnormal coagulation profile: Secondary | ICD-10-CM | POA: Insufficient documentation

## 2022-01-03 DIAGNOSIS — R0602 Shortness of breath: Secondary | ICD-10-CM | POA: Diagnosis not present

## 2022-01-03 LAB — CBC WITH DIFFERENTIAL/PLATELET
Abs Immature Granulocytes: 0.01 10*3/uL (ref 0.00–0.07)
Basophils Absolute: 0.1 10*3/uL (ref 0.0–0.1)
Basophils Relative: 1 %
Eosinophils Absolute: 0.3 10*3/uL (ref 0.0–0.5)
Eosinophils Relative: 5 %
HCT: 49.4 % (ref 39.0–52.0)
Hemoglobin: 17 g/dL (ref 13.0–17.0)
Immature Granulocytes: 0 %
Lymphocytes Relative: 26 %
Lymphs Abs: 1.3 10*3/uL (ref 0.7–4.0)
MCH: 31.3 pg (ref 26.0–34.0)
MCHC: 34.4 g/dL (ref 30.0–36.0)
MCV: 90.8 fL (ref 80.0–100.0)
Monocytes Absolute: 0.5 10*3/uL (ref 0.1–1.0)
Monocytes Relative: 9 %
Neutro Abs: 3.1 10*3/uL (ref 1.7–7.7)
Neutrophils Relative %: 59 %
Platelets: 290 10*3/uL (ref 150–400)
RBC: 5.44 MIL/uL (ref 4.22–5.81)
RDW: 12.9 % (ref 11.5–15.5)
WBC: 5.2 10*3/uL (ref 4.0–10.5)
nRBC: 0 % (ref 0.0–0.2)

## 2022-01-03 LAB — COMPREHENSIVE METABOLIC PANEL
ALT: 21 U/L (ref 0–44)
AST: 22 U/L (ref 15–41)
Albumin: 4.4 g/dL (ref 3.5–5.0)
Alkaline Phosphatase: 63 U/L (ref 38–126)
Anion gap: 8 (ref 5–15)
BUN: 14 mg/dL (ref 6–20)
CO2: 25 mmol/L (ref 22–32)
Calcium: 9.5 mg/dL (ref 8.9–10.3)
Chloride: 105 mmol/L (ref 98–111)
Creatinine, Ser: 0.75 mg/dL (ref 0.61–1.24)
GFR, Estimated: >60 mL/min (ref >60)
Glucose, Bld: 85 mg/dL (ref 70–99)
Potassium: 3.7 mmol/L (ref 3.5–5.1)
Sodium: 138 mmol/L (ref 135–145)
Total Bilirubin: 1 mg/dL (ref 0.3–1.2)
Total Protein: 7.6 g/dL (ref 6.5–8.1)

## 2022-01-03 LAB — TROPONIN I (HIGH SENSITIVITY): Troponin I (High Sensitivity): 3 ng/L (ref <18)

## 2022-01-03 MED ORDER — IOHEXOL 350 MG/ML SOLN
100.0000 mL | Freq: Once | INTRAVENOUS | Status: AC | PRN
  Administered 2022-01-03: 75 mL via INTRAVENOUS

## 2022-01-03 NOTE — Discharge Instructions (Signed)
You were seen in the emergency room today with mild shortness of breath and elevated D-dimer.  Your lab work and EKG here are reassuring.  Your CT scan did not show any blood clots in the lungs, pneumonia, fluid in the lungs to explain your symptoms.  Please continue to follow with your primary care doctor and return with any new or suddenly worsening symptoms. ?

## 2022-01-03 NOTE — ED Provider Notes (Signed)
? ?Emergency Department Provider Note ? ? ?I have reviewed the triage vital signs and the nursing notes. ? ? ?HISTORY ? ?Chief Complaint ?Abnormal Lab ? ? ?HPI ?Micheal Griffith is a 46 y.o. male with past medical history reviewed below presents emergency department for evaluation of mild shortness of breath symptoms.  Patient had recent trip to [place] and notes a history of anxiety.  He states that the trip was stressful at times and he was initially having symptoms he thought was related to anxiety.  Symptoms worsened to the point of going to urgent care there where work-up was done including D-dimer which came back elevated.  He did not have imaging at that time.  He denies any leg swelling or pain.  No chest discomfort.  He returned home and decided to be seen at urgent care with some continued mild symptoms.  He was seen yesterday and D-dimer came back greater than 2 at that visit (shows me in medical app on phone).  He was advised to come to the emergency department for further evaluation. ? ? ?Past Medical History:  ?Diagnosis Date  ? Depression 2000  ? ? ?Review of Systems ? ?Constitutional: No fever/chills ?Eyes: No visual changes. ?ENT: No sore throat. ?Cardiovascular: Denies chest pain. ?Respiratory: Positive shortness of breath. ?Gastrointestinal: No abdominal pain.  No nausea, no vomiting.  No diarrhea.  No constipation. ?Genitourinary: Negative for dysuria. ?Musculoskeletal: Negative for back pain. ?Skin: Negative for rash. ?Neurological: Negative for headaches, focal weakness or numbness. ? ?____________________________________________ ? ? ?PHYSICAL EXAM: ? ?VITAL SIGNS: ?ED Triage Vitals [01/03/22 0804]  ?Enc Vitals Group  ?   BP (!) 143/85  ?   Pulse Rate 60  ?   Resp 18  ?   Temp 98.1 ?F (36.7 ?C)  ?   Temp Source Oral  ?   SpO2 100 %  ?   Weight 143 lb (64.9 kg)  ?   Height 5\' 8"  (1.727 m)  ? ?Constitutional: Alert and oriented. Well appearing and in no acute distress. ?Eyes: Conjunctivae are  normal.  ?Head: Atraumatic. ?Nose: No congestion/rhinnorhea. ?Mouth/Throat: Mucous membranes are moist.   ?Neck: No stridor.  ?Cardiovascular: Normal rate, regular rhythm. Good peripheral circulation. Grossly normal heart sounds.   ?Respiratory: Normal respiratory effort.  No retractions. Lungs CTAB. ?Gastrointestinal: Soft and nontender. No distention.  ?Musculoskeletal: No lower extremity tenderness nor edema. No gross deformities of extremities. ?Neurologic:  Normal speech and language. No gross focal neurologic deficits are appreciated.  ?Skin:  Skin is warm, dry and intact. No rash noted. ? ?____________________________________________ ?  ?LABS ?(all labs ordered are listed, but only abnormal results are displayed) ? ?Labs Reviewed  ?COMPREHENSIVE METABOLIC PANEL  ?CBC WITH DIFFERENTIAL/PLATELET  ?TROPONIN I (HIGH SENSITIVITY)  ?TROPONIN I (HIGH SENSITIVITY)  ? ?____________________________________________ ? ?EKG ? ? EKG Interpretation ? ?Date/Time:  Friday January 03 2022 08:25:06 EDT ?Ventricular Rate:  59 ?PR Interval:  181 ?QRS Duration: 105 ?QT Interval:  423 ?QTC Calculation: 419 ?R Axis:   8 ?Text Interpretation: Sinus rhythm Left ventricular hypertrophy ST elevation suggests acute pericarditis No prior tracing for comparison Confirmed by [id number]) on 01/03/2022 8:34:07 AM ?  ? ?  ? ? ?____________________________________________ ? ?RADIOLOGY ? ?CT Angio Chest PE W and/or Wo Contrast ? ?Result Date: 01/03/2022 ?CLINICAL DATA:  Elevated D-dimer, recent air travel EXAM: CT ANGIOGRAPHY CHEST WITH CONTRAST TECHNIQUE: Multidetector CT imaging of the chest was performed using the standard protocol during bolus administration of intravenous contrast.  Multiplanar CT image reconstructions and MIPs were obtained to evaluate the vascular anatomy. RADIATION DOSE REDUCTION: This exam was performed according to the departmental dose-optimization program which includes automated exposure control, adjustment of  the mA and/or kV according to patient size and/or use of iterative reconstruction technique. CONTRAST:  90mL OMNIPAQUE IOHEXOL 350 MG/ML SOLN COMPARISON:  None. FINDINGS: Cardiovascular: There is adequate opacification of the pulmonary arteries to the segmental level. There is no evidence of pulmonary embolism. The heart is not enlarged. There is no pericardial effusion. The thoracic aorta is normal. Mediastinum/Nodes: The thyroid is unremarkable. The esophagus is grossly unremarkable. There is no mediastinal, hilar, or axillary lymphadenopathy. Lungs/Pleura: The trachea and central airways are patent. The lungs are well inflated. There is no focal consolidation or pulmonary edema. There is no pleural effusion or pneumothorax. There are no suspicious nodules. Upper Abdomen: The imaged portions of the upper abdominal viscera are unremarkable. Musculoskeletal: Is no acute osseous abnormality or suspicious osseous lesion. Review of the MIP images confirms the above findings. IMPRESSION: No evidence of pulmonary embolism or other acute cardiopulmonary process. Electronically Signed   By: Lesia Hausen M.D.   On: 01/03/2022 09:48   ? ?____________________________________________ ? ? ?PROCEDURES ? ?Procedure(s) performed:  ? ?Procedures ? ?None ?____________________________________________ ? ? ?INITIAL IMPRESSION / ASSESSMENT AND PLAN / ED COURSE ? ?Pertinent labs & imaging results that were available during my care of the patient were reviewed by me and considered in my medical decision making (see chart for details). ?  ?This patient is Presenting for Evaluation of SOB, which does require a range of treatment options, and is a complaint that involves a high risk of morbidity and mortality. ? ?The Differential Diagnoses include PE, ACS, CAP, COVID, Flu. ? ? ?I decided to review pertinent External Data, and in summary patient with UC labs from [place] in Care Everywhere showing elevated d-dimer. No imaging. ?  ?Clinical  Laboratory Tests Ordered, included normal troponin. No AKI. No leukocytosis or anemia.  ? ?Radiologic Tests Ordered, included CTA PE. I independently interpreted the images and agree with radiology interpretation.  ? ?Cardiac Monitor Tracing which shows NSR. ? ? ?Social Determinants of Health Risk denies EtOH or smoking.  ? ?Medical Decision Making: Summary:  ?Patient presents to the emergency department for evaluation of mild shortness of breath after a [place] trip/flight to [place].  Elevated D-dimer both in [place] and here in [place].  Lab work is largely reassuring.  Low suspicion overall for PE but given increased risk of travel and mild symptoms along with D-dimer plan for CTA of the chest along with screening blood work.  EKG interpreted by me as above. Doubt ACS clinically. No infectious symptoms to prompt viral testing at this time.  ? ?Reevaluation with update and discussion with patient and family at bedside. CTA without PE or other acute process. Plan for close PCP follow up. Discussed ED return precautions.  ? ?Disposition: discharge ? ?____________________________________________ ? ?FINAL CLINICAL IMPRESSION(S) / ED DIAGNOSES ? ?Final diagnoses:  ?SOB (shortness of breath)  ?Positive D dimer  ? ? ?Note:  This document was prepared using Dragon voice recognition software and may include unintentional dictation errors. ? ?Alona Bene, MD, FACEP ?Emergency Medicine ? ?  ?Maia Plan, MD ?01/03/22 1007 ? ?

## 2022-01-03 NOTE — ED Triage Notes (Signed)
Pt seen at urgent care yesterday and informed D Dimer elevated and needed to come to ED ?

## 2022-01-23 DIAGNOSIS — K635 Polyp of colon: Secondary | ICD-10-CM | POA: Diagnosis not present

## 2022-01-23 DIAGNOSIS — Z1211 Encounter for screening for malignant neoplasm of colon: Secondary | ICD-10-CM | POA: Diagnosis not present

## 2022-01-23 DIAGNOSIS — D123 Benign neoplasm of transverse colon: Secondary | ICD-10-CM | POA: Diagnosis not present

## 2022-01-30 DIAGNOSIS — F419 Anxiety disorder, unspecified: Secondary | ICD-10-CM | POA: Diagnosis not present

## 2022-01-30 DIAGNOSIS — Z79899 Other long term (current) drug therapy: Secondary | ICD-10-CM | POA: Diagnosis not present

## 2022-01-30 DIAGNOSIS — F331 Major depressive disorder, recurrent, moderate: Secondary | ICD-10-CM | POA: Diagnosis not present

## 2022-02-03 DIAGNOSIS — Z8616 Personal history of COVID-19: Secondary | ICD-10-CM | POA: Diagnosis not present

## 2022-02-03 DIAGNOSIS — Z85828 Personal history of other malignant neoplasm of skin: Secondary | ICD-10-CM | POA: Diagnosis not present

## 2022-02-03 DIAGNOSIS — Z125 Encounter for screening for malignant neoplasm of prostate: Secondary | ICD-10-CM | POA: Diagnosis not present

## 2022-02-03 DIAGNOSIS — I498 Other specified cardiac arrhythmias: Secondary | ICD-10-CM | POA: Diagnosis not present

## 2022-02-03 DIAGNOSIS — Z1211 Encounter for screening for malignant neoplasm of colon: Secondary | ICD-10-CM | POA: Diagnosis not present

## 2022-02-03 DIAGNOSIS — E785 Hyperlipidemia, unspecified: Secondary | ICD-10-CM | POA: Diagnosis not present

## 2022-02-03 DIAGNOSIS — Z9889 Other specified postprocedural states: Secondary | ICD-10-CM | POA: Diagnosis not present

## 2022-02-03 DIAGNOSIS — J310 Chronic rhinitis: Secondary | ICD-10-CM | POA: Diagnosis not present

## 2022-02-03 DIAGNOSIS — Z0000 Encounter for general adult medical examination without abnormal findings: Secondary | ICD-10-CM | POA: Diagnosis not present

## 2022-02-03 DIAGNOSIS — R9431 Abnormal electrocardiogram [ECG] [EKG]: Secondary | ICD-10-CM | POA: Diagnosis not present

## 2022-02-03 DIAGNOSIS — I517 Cardiomegaly: Secondary | ICD-10-CM | POA: Diagnosis not present

## 2022-03-25 DIAGNOSIS — F419 Anxiety disorder, unspecified: Secondary | ICD-10-CM | POA: Diagnosis not present

## 2022-03-25 DIAGNOSIS — F331 Major depressive disorder, recurrent, moderate: Secondary | ICD-10-CM | POA: Diagnosis not present

## 2022-04-28 DIAGNOSIS — F419 Anxiety disorder, unspecified: Secondary | ICD-10-CM | POA: Diagnosis not present

## 2022-04-28 DIAGNOSIS — F331 Major depressive disorder, recurrent, moderate: Secondary | ICD-10-CM | POA: Diagnosis not present

## 2022-05-08 DIAGNOSIS — Z20822 Contact with and (suspected) exposure to covid-19: Secondary | ICD-10-CM | POA: Diagnosis not present

## 2022-05-08 DIAGNOSIS — J029 Acute pharyngitis, unspecified: Secondary | ICD-10-CM | POA: Diagnosis not present

## 2022-05-20 DIAGNOSIS — F331 Major depressive disorder, recurrent, moderate: Secondary | ICD-10-CM | POA: Diagnosis not present

## 2022-05-20 DIAGNOSIS — F419 Anxiety disorder, unspecified: Secondary | ICD-10-CM | POA: Diagnosis not present

## 2022-06-10 DIAGNOSIS — Z23 Encounter for immunization: Secondary | ICD-10-CM | POA: Diagnosis not present

## 2022-08-14 DIAGNOSIS — F410 Panic disorder [episodic paroxysmal anxiety] without agoraphobia: Secondary | ICD-10-CM | POA: Diagnosis not present

## 2022-08-14 DIAGNOSIS — F5101 Primary insomnia: Secondary | ICD-10-CM | POA: Diagnosis not present

## 2022-08-18 DIAGNOSIS — F331 Major depressive disorder, recurrent, moderate: Secondary | ICD-10-CM | POA: Diagnosis not present

## 2022-08-18 DIAGNOSIS — F419 Anxiety disorder, unspecified: Secondary | ICD-10-CM | POA: Diagnosis not present

## 2022-09-22 DIAGNOSIS — D2271 Melanocytic nevi of right lower limb, including hip: Secondary | ICD-10-CM | POA: Diagnosis not present

## 2022-09-22 DIAGNOSIS — D225 Melanocytic nevi of trunk: Secondary | ICD-10-CM | POA: Diagnosis not present

## 2022-09-22 DIAGNOSIS — L821 Other seborrheic keratosis: Secondary | ICD-10-CM | POA: Diagnosis not present

## 2022-09-22 DIAGNOSIS — L820 Inflamed seborrheic keratosis: Secondary | ICD-10-CM | POA: Diagnosis not present

## 2022-09-22 DIAGNOSIS — L570 Actinic keratosis: Secondary | ICD-10-CM | POA: Diagnosis not present

## 2022-09-22 DIAGNOSIS — D2261 Melanocytic nevi of right upper limb, including shoulder: Secondary | ICD-10-CM | POA: Diagnosis not present

## 2022-11-13 ENCOUNTER — Encounter: Payer: Self-pay | Admitting: Adult Health

## 2022-11-13 ENCOUNTER — Ambulatory Visit (INDEPENDENT_AMBULATORY_CARE_PROVIDER_SITE_OTHER): Payer: BC Managed Care – PPO | Admitting: Adult Health

## 2022-11-13 VITALS — BP 120/76 | HR 60 | Temp 98.0°F | Ht 68.0 in | Wt 159.8 lb

## 2022-11-13 DIAGNOSIS — R400 Somnolence: Secondary | ICD-10-CM | POA: Diagnosis not present

## 2022-11-13 DIAGNOSIS — R0683 Snoring: Secondary | ICD-10-CM | POA: Diagnosis not present

## 2022-11-13 NOTE — Patient Instructions (Addendum)
Set up home sleep study  Healthy sleep regimen  Follow up in 6 weeks to discuss results and treatment plan .

## 2022-11-13 NOTE — Progress Notes (Signed)
$'[name]$  ID: Micheal Griffith, male    DOB: 12/03/1975, 47 y.o.   MRN: [id number]  Chief Complaint  Patient presents with   Consult    Referring provider: No ref. provider found  HPI: 47 year old male seen for sleep consult November 13, 2022 for restless sleep, daytime sleepiness and gasping for air during sleep  TEST/EVENTS :   11/13/2022 Sleep consult  Patient presents for sleep consult today.  Kindly referred by Dr. Rogers Blocker.  Patient complains of waking up exhausted after sleeping for a good amount of time.  Always feels tired.  Feels very irritable throughout the day.  Has difficulty falling asleep and feels that sleep is very fragmented.  Wakes up feeling unrefreshed.  Also has gasping for air during his sleep.  Typically goes to sleep about 9 PM.  Takes 30 minutes to go to sleep.  Is up a couple times throughout the night.  Gets up at 7 AM.  Does not operate heavy machinery for work.  Weight is up about 5 pounds over the last 2 years.  Current weight is at 159 pounds with a BMI 24.  Has never had a sleep study before.  No removable dental work.  No symptoms suspicious for cataplexy or sleep paralysis.  Says he does have some difficulty turning his mind off at night.  Patient says he is very active exercises daily.  Is a runner.  Has no caffeine intake.  No history of congestive heart failure or stroke.  Patient is taking Zoloft and trazodone to help with anxiety and insomnia.  He does feel that this helps some. Epworth score is 2 out of 24.  Typically gets sleepy in the evening hours and after lunch.  Social history  Patient is married.  Has 2 children.  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  He is a never smoker.  Quit drinking alcohol in 2017.  No drug use.  Family history is negative.  Surgical history is negative.  Allergies  Allergen Reactions   Sulfa Antibiotics     Immunization History  Administered Date(s) Administered   Influenza,inj,Quad PF,6+ Mos 06/19/2022    Influenza-Unspecified 06/15/2020   PFIZER(Purple Top)SARS-COV-2 Vaccination 12/01/2019, 12/26/2019   Pneumococcal Polysaccharide-23 05/17/2019   Tdap 05/17/2019    Past Medical History:  Diagnosis Date   Depression 2000    Tobacco History: Social History   Tobacco Use  Smoking Status Never  Smokeless Tobacco Never   Counseling given: Not Answered   Outpatient Medications Prior to Visit  Medication Sig Dispense Refill   fluticasone (FLONASE) 50 MCG/ACT nasal spray Place into both nostrils.     sertraline (ZOLOFT) 100 MG tablet Take 100 mg by mouth daily.     traZODone (DESYREL) 50 MG tablet Take 50 mg by mouth at bedtime.     guaiFENesin (MUCINEX) 600 MG 12 hr tablet Take 2 tablets (1,200 mg total) by mouth 2 (two) times daily as needed. 60 tablet 2   LORazepam ER (LOREEV XR) 3 MG CS24 Take 1 capsule by mouth daily as needed. 90 capsule 1   QUEtiapine (SEROQUEL) 25 MG tablet Take 1 tablet (25 mg total) by mouth at bedtime. 90 tablet 0   No facility-administered medications prior to visit.     Review of Systems:   Constitutional:   No  weight loss, night sweats,  Fevers, chills, + fatigue, or  lassitude.  HEENT:   No headaches,  Difficulty swallowing,  Tooth/dental problems, or  Sore throat,  No sneezing, itching, ear ache, nasal congestion, post nasal drip,   CV:  No chest pain,  Orthopnea, PND, swelling in lower extremities, anasarca, dizziness, palpitations, syncope.   GI  No heartburn, indigestion, abdominal pain, nausea, vomiting, diarrhea, change in bowel habits, loss of appetite, bloody stools.   Resp: No shortness of breath with exertion or at rest.  No excess mucus, no productive cough,  No non-productive cough,  No coughing up of blood.  No change in color of mucus.  No wheezing.  No chest wall deformity  Skin: no rash or lesions.  GU: no dysuria, change in color of urine, no urgency or frequency.  No flank pain, no hematuria   MS:  No joint  pain or swelling.  No decreased range of motion.  No back pain.    Physical Exam  BP 120/76 (BP Location: Left Arm, Cuff Size: Normal)   Pulse 60   Temp 98 F (36.7 C) (Oral)   Ht '5\' 8"'$  (1.727 m)   Wt 159 lb 12.8 oz (72.5 kg)   SpO2 99%   BMI 24.30 kg/m   GEN: A/Ox3; pleasant , NAD, well nourished    HEENT:  [place]/AT,  NOSE-clear, THROAT-clear, no lesions, no postnasal drip or exudate noted. Class 2-3 MP airway   NECK:  Supple w/ fair ROM; no JVD; normal carotid impulses w/o bruits; no thyromegaly or nodules palpated; no lymphadenopathy.    RESP  Clear  P & A; w/o, wheezes/ rales/ or rhonchi. no accessory muscle use, no dullness to percussion  CARD:  RRR, no m/r/g, no peripheral edema, pulses intact, no cyanosis or clubbing.  GI:   Soft & nt; nml bowel sounds; no organomegaly or masses detected.   Musco: Warm bil, no deformities or joint swelling noted.   Neuro: alert, no focal deficits noted.    Skin: Warm, no lesions or rashes    Lab Results:  CBC    Component Value Date/Time   WBC 5.2 01/03/2022 0828   RBC 5.44 01/03/2022 0828   HGB 17.0 01/03/2022 0828   HCT 49.4 01/03/2022 0828   PLT 290 01/03/2022 0828   MCV 90.8 01/03/2022 0828   MCH 31.3 01/03/2022 0828   MCHC 34.4 01/03/2022 0828   RDW 12.9 01/03/2022 0828   LYMPHSABS 1.3 01/03/2022 0828   MONOABS 0.5 01/03/2022 0828   EOSABS 0.3 01/03/2022 0828   BASOSABS 0.1 01/03/2022 0828    BMET    Component Value Date/Time   NA 138 01/03/2022 0828   K 3.7 01/03/2022 0828   CL 105 01/03/2022 0828   CO2 25 01/03/2022 0828   GLUCOSE 85 01/03/2022 0828   BUN 14 01/03/2022 0828   CREATININE 0.75 01/03/2022 0828   CALCIUM 9.5 01/03/2022 0828   GFRNONAA >60 01/03/2022 0828    BNP No results found for: "BNP"  ProBNP No results found for: "PROBNP"  Imaging: No results found.        No data to display          No results found for: "NITRICOXIDE"      Assessment & Plan:   No  problem-specific Assessment & Plan notes found for this encounter.     Rexene Edison, NP 11/13/2022

## 2022-11-17 DIAGNOSIS — R400 Somnolence: Secondary | ICD-10-CM | POA: Insufficient documentation

## 2022-11-17 NOTE — Progress Notes (Signed)
Reviewed and agree with assessment/plan.   Chesley Mires, MD [facility] Pulmonary/Critical Care 11/17/2022, 10:24 AM Pager:  [phone]

## 2022-11-17 NOTE — Assessment & Plan Note (Signed)
Daytime sleepiness, witnessed apneic events, gasping for air during sleep, fragmented sleep, insomnia all concerning for underlying sleep apnea.  Will set patient up for home sleep study.  Patient education given  - discussed how weight can impact sleep and risk for sleep disordered breathing - discussed options to assist with weight loss: combination of diet modification, cardiovascular and strength training exercises   - had an extensive discussion regarding the adverse health consequences related to untreated sleep disordered breathing - specifically discussed the risks for hypertension, coronary artery disease, cardiac dysrhythmias, cerebrovascular disease, and diabetes - lifestyle modification discussed   - discussed how sleep disruption can increase risk of accidents, particularly when driving - safe driving practices were discussed   Plan  Patient Instructions  Set up home sleep study  Healthy sleep regimen  Follow up in 6 weeks to discuss results and treatment plan .

## 2022-11-18 DIAGNOSIS — F331 Major depressive disorder, recurrent, moderate: Secondary | ICD-10-CM | POA: Diagnosis not present

## 2022-11-18 DIAGNOSIS — F419 Anxiety disorder, unspecified: Secondary | ICD-10-CM | POA: Diagnosis not present

## 2022-12-25 ENCOUNTER — Ambulatory Visit: Payer: BC Managed Care – PPO | Admitting: Adult Health

## 2023-01-06 ENCOUNTER — Ambulatory Visit: Payer: BC Managed Care – PPO | Admitting: Adult Health

## 2023-01-06 ENCOUNTER — Telehealth: Payer: Self-pay | Admitting: *Deleted

## 2023-01-06 DIAGNOSIS — G4733 Obstructive sleep apnea (adult) (pediatric): Secondary | ICD-10-CM

## 2023-01-06 DIAGNOSIS — R0683 Snoring: Secondary | ICD-10-CM

## 2023-01-06 NOTE — Telephone Encounter (Signed)
Patient arrived for follow up on HST through [facility].  Patient states that it has been at least 2 weeks since he did the test.  Apologized that he made the trip and that I was under the impression that his study was being read and that it would be ready for his visit.  Advised that we will call him with the results and he will not have to come in for an OV.  He verbalized understanding.

## 2023-01-07 ENCOUNTER — Encounter (INDEPENDENT_AMBULATORY_CARE_PROVIDER_SITE_OTHER): Payer: BC Managed Care – PPO

## 2023-01-07 DIAGNOSIS — G4733 Obstructive sleep apnea (adult) (pediatric): Secondary | ICD-10-CM | POA: Diagnosis not present

## 2023-01-07 DIAGNOSIS — R0683 Snoring: Secondary | ICD-10-CM

## 2023-01-07 NOTE — Telephone Encounter (Signed)
Sleep study has been scanned into pt's Chart.

## 2023-01-07 NOTE — Telephone Encounter (Signed)
I just read the study this morning.  He has mild sleep apnea.  The results should be getting faxed to the office later today or tomorrow.

## 2023-01-09 NOTE — Telephone Encounter (Signed)
Referral has been placed. Nothing further needed.   

## 2023-01-09 NOTE — Telephone Encounter (Signed)
Home sleep study done on December 28, 2022 showed very mild sleep apnea with AHI at 5.8/hour and SpO2 low at 85%. Baseline O2 sats  99%.    Called with test results , reviewed in detail .  Refer for oral appliance to Dr. Myrtis Ser. -[facility] Micheal Griffith

## 2023-03-03 DIAGNOSIS — F5101 Primary insomnia: Secondary | ICD-10-CM | POA: Diagnosis not present

## 2023-03-03 DIAGNOSIS — F410 Panic disorder [episodic paroxysmal anxiety] without agoraphobia: Secondary | ICD-10-CM | POA: Diagnosis not present

## 2023-05-01 DIAGNOSIS — G4733 Obstructive sleep apnea (adult) (pediatric): Secondary | ICD-10-CM | POA: Diagnosis not present

## 2023-07-05 IMAGING — CT CT ANGIO CHEST
2 of 8 series · 19 of 36 positions shown · IV contrast (Omnipaque)
Comparison: None.

CLINICAL DATA: Elevated D-dimer, recent air travel

EXAM:
CT ANGIOGRAPHY CHEST WITH CONTRAST
TECHNIQUE: Multidetector CT imaging of the chest was performed using the
standard protocol during bolus administration of intravenous
contrast. Multiplanar CT image reconstructions and MIPs were
obtained to evaluate the vascular anatomy.

[Series 6: pe thins · axial · 0.63mm/px · z∈[-265,-18]mm · 18 of 277 slices shown]
[im 15/277  lung]
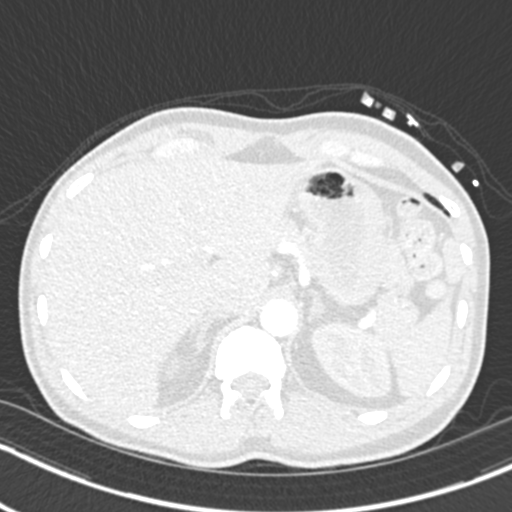
[im 30/277  mediastinal]
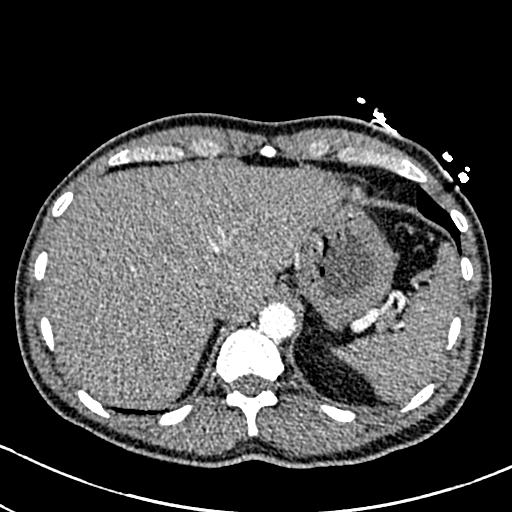
[im 44/277  lung]
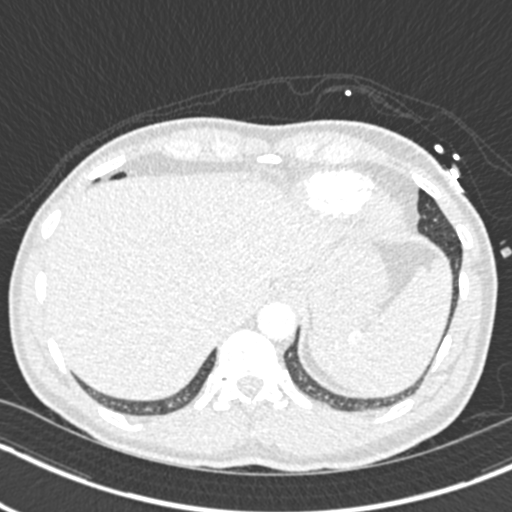
[im 59/277  mediastinal]
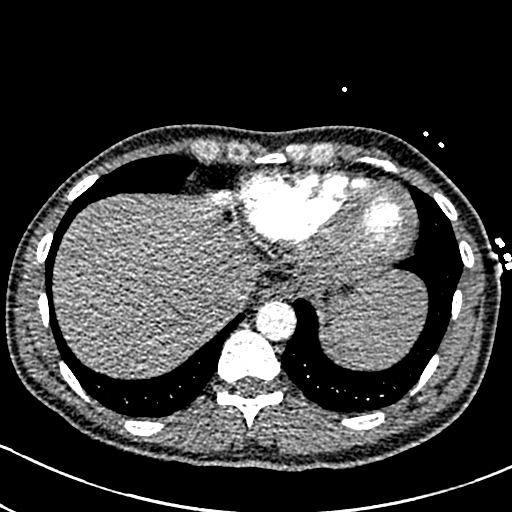
[im 73/277  lung]
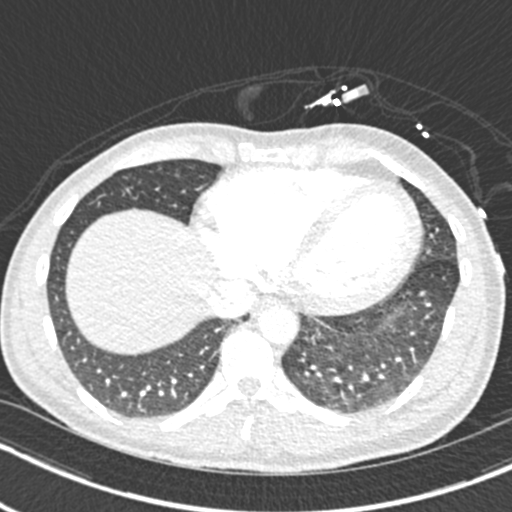
[im 88/277  mediastinal]
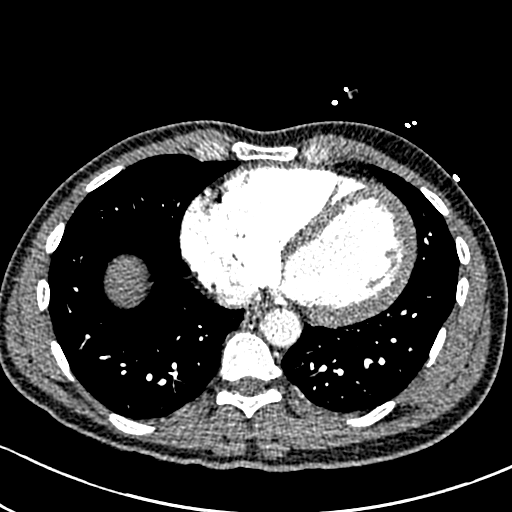
[im 102/277  lung]
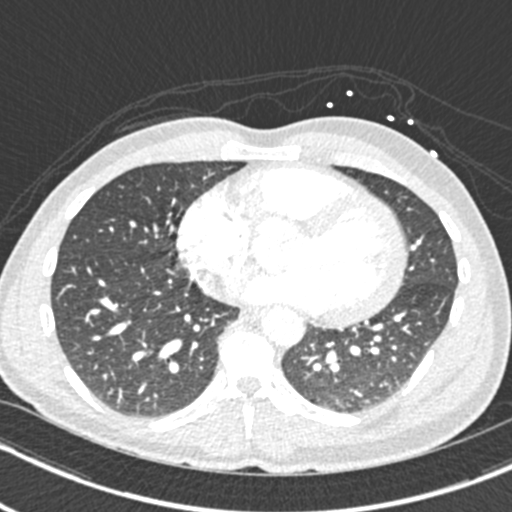
[im 117/277  mediastinal]
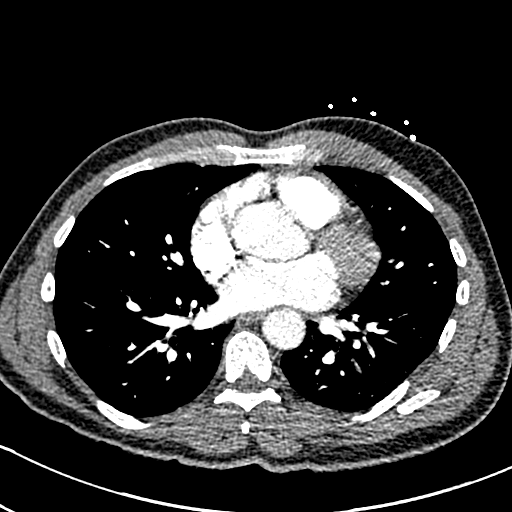
[im 131/277  lung]
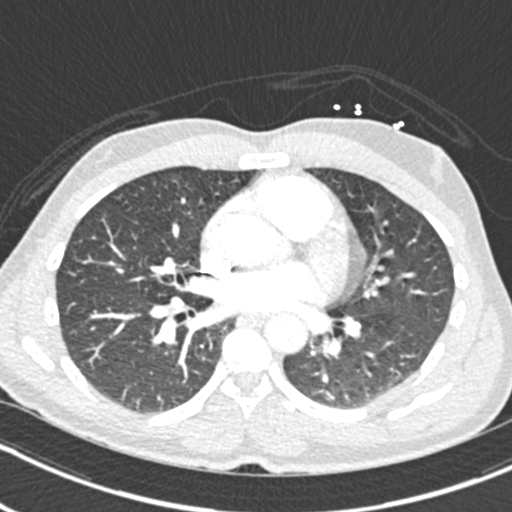
[im 146/277  mediastinal]
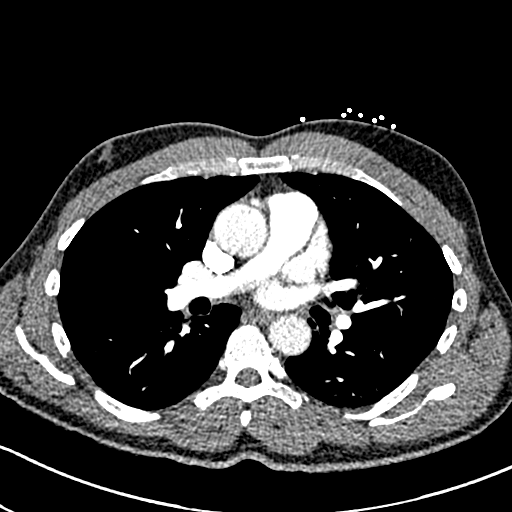
[im 160/277  lung]
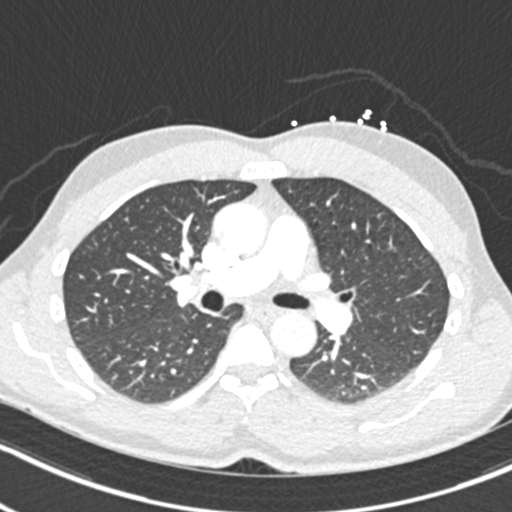
[im 175/277  mediastinal]
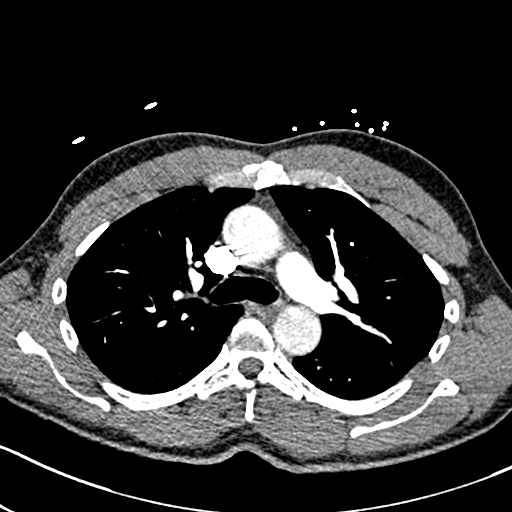
[im 189/277  lung]
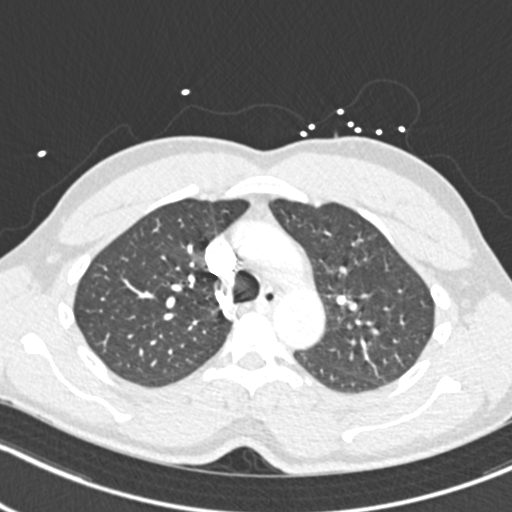
[im 204/277  mediastinal]
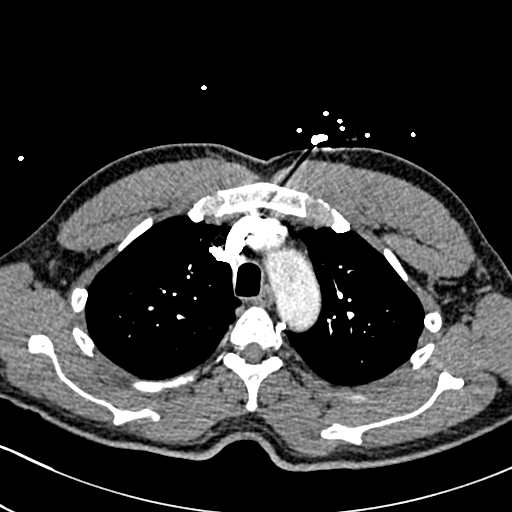
[im 218/277  lung]
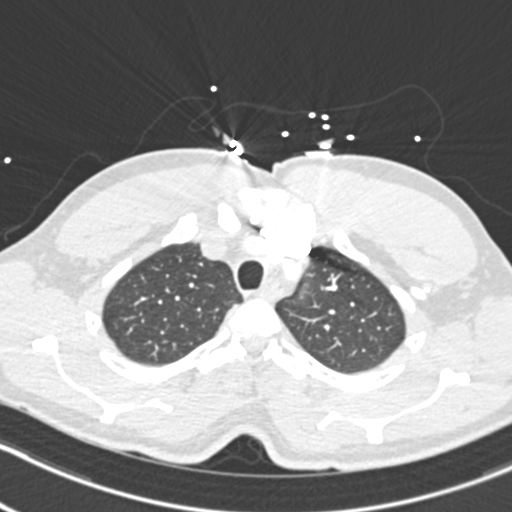
[im 233/277  mediastinal]
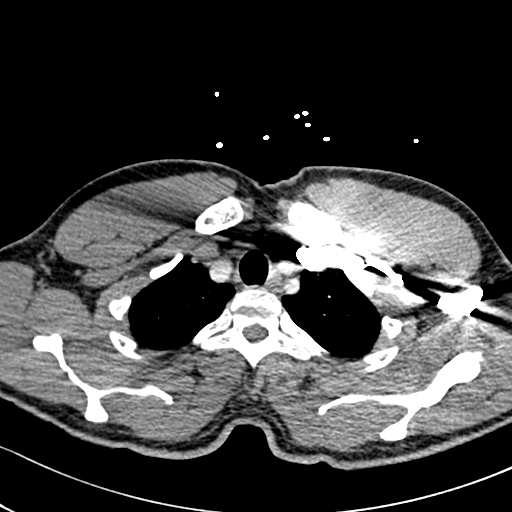
[im 247/277  lung]
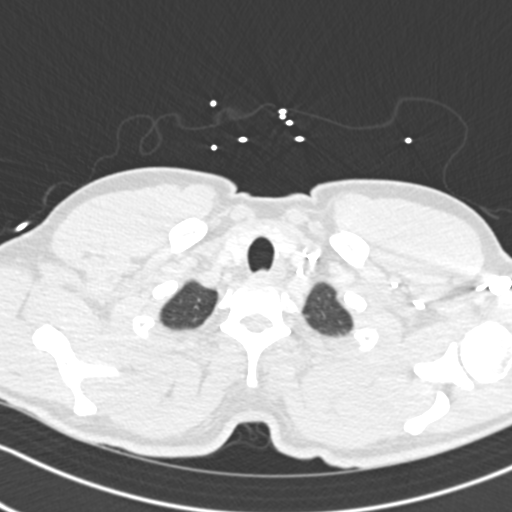
[im 262/277  mediastinal]
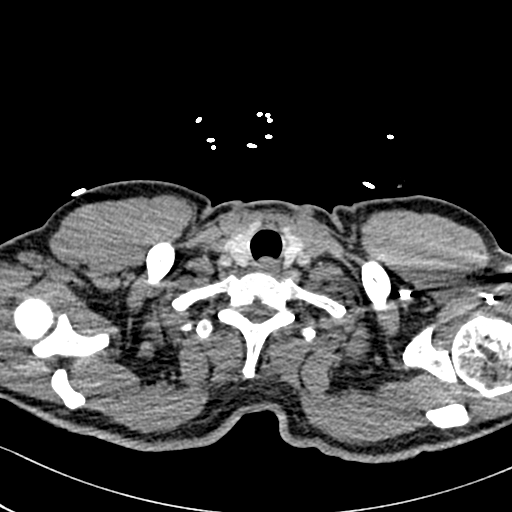

[Series 7: pe coronal mpr · coronal · 0.58mm/px · 1 of 104 slices shown]
[im 52/104  mediastinal]
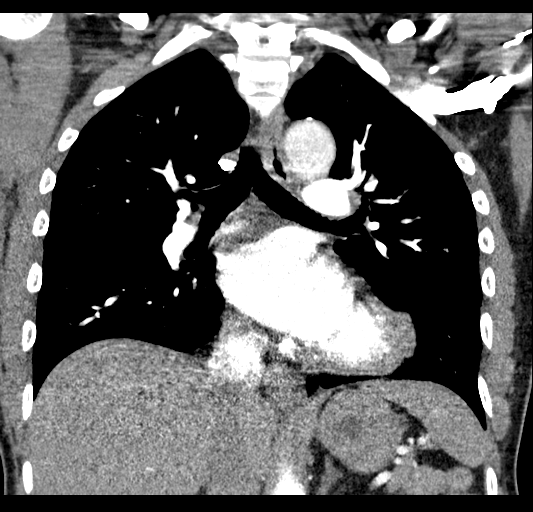

[19 of 36 positions shown; findings below may reference images not displayed]

RADIATION DOSE REDUCTION: This exam was performed according to the
departmental dose-optimization program which includes automated
exposure control, adjustment of the mA and/or kV according to
patient size and/or use of iterative reconstruction technique.

CONTRAST:  75mL OMNIPAQUE IOHEXOL 350 MG/ML SOLN
FINDINGS: Cardiovascular: There is adequate opacification of the pulmonary
arteries to the segmental level. There is no evidence of pulmonary
embolism. The heart is not enlarged. There is no pericardial
effusion. The thoracic aorta is normal.

Mediastinum/Nodes: The thyroid is unremarkable. The esophagus is
grossly unremarkable. There is no mediastinal, hilar, or axillary
lymphadenopathy.

Lungs/Pleura: The trachea and central airways are patent.

The lungs are well inflated. There is no focal consolidation or
pulmonary edema. There is no pleural effusion or pneumothorax.

There are no suspicious nodules.

Upper Abdomen: The imaged portions of the upper abdominal viscera
are unremarkable.

Musculoskeletal: Is no acute osseous abnormality or suspicious
osseous lesion.

Review of the MIP images confirms the above findings.
IMPRESSION: No evidence of pulmonary embolism or other acute cardiopulmonary
process.

## 2023-09-23 DIAGNOSIS — L570 Actinic keratosis: Secondary | ICD-10-CM | POA: Diagnosis not present

## 2023-09-23 DIAGNOSIS — L821 Other seborrheic keratosis: Secondary | ICD-10-CM | POA: Diagnosis not present

## 2023-09-23 DIAGNOSIS — D225 Melanocytic nevi of trunk: Secondary | ICD-10-CM | POA: Diagnosis not present

## 2023-09-23 DIAGNOSIS — D2271 Melanocytic nevi of right lower limb, including hip: Secondary | ICD-10-CM | POA: Diagnosis not present

## 2023-11-23 ENCOUNTER — Other Ambulatory Visit: Payer: Self-pay | Admitting: Family Medicine

## 2023-11-23 DIAGNOSIS — E785 Hyperlipidemia, unspecified: Secondary | ICD-10-CM

## 2023-12-08 ENCOUNTER — Ambulatory Visit
Admission: RE | Admit: 2023-12-08 | Discharge: 2023-12-08 | Disposition: A | Discharge status: Home / Self Care | Source: Ambulatory Visit | Attending: Family Medicine | Admitting: Family Medicine

## 2023-12-08 DIAGNOSIS — E785 Hyperlipidemia, unspecified: Secondary | ICD-10-CM
